# Patient Record
Sex: Male | Born: 1975 | Race: Black or African American | Hispanic: No | Marital: Single | Smoking: Current every day smoker
Health system: Southern US, Community
[De-identification: ages and names within clinical notes are randomized; demographics above are authoritative.]

## PROBLEM LIST (undated history)

## (undated) DIAGNOSIS — K529 Noninfective gastroenteritis and colitis, unspecified: Secondary | ICD-10-CM

---

## 2015-03-06 ENCOUNTER — Encounter (HOSPITAL_COMMUNITY): Payer: Self-pay | Admitting: Emergency Medicine

## 2015-03-06 ENCOUNTER — Inpatient Hospital Stay (HOSPITAL_COMMUNITY)
Admission: EM | Admit: 2015-03-06 | Discharge: 2015-03-10 | DRG: 342 | Disposition: A | Discharge status: Home / Self Care | Payer: Self-pay | Attending: Family Medicine | Admitting: Family Medicine

## 2015-03-06 DIAGNOSIS — D72829 Elevated white blood cell count, unspecified: Secondary | ICD-10-CM | POA: Diagnosis present

## 2015-03-06 DIAGNOSIS — Z8249 Family history of ischemic heart disease and other diseases of the circulatory system: Secondary | ICD-10-CM

## 2015-03-06 DIAGNOSIS — R3911 Hesitancy of micturition: Secondary | ICD-10-CM | POA: Diagnosis not present

## 2015-03-06 DIAGNOSIS — K529 Noninfective gastroenteritis and colitis, unspecified: Secondary | ICD-10-CM

## 2015-03-06 DIAGNOSIS — K6389 Other specified diseases of intestine: Principal | ICD-10-CM | POA: Diagnosis present

## 2015-03-06 DIAGNOSIS — Z833 Family history of diabetes mellitus: Secondary | ICD-10-CM

## 2015-03-06 DIAGNOSIS — K389 Disease of appendix, unspecified: Secondary | ICD-10-CM

## 2015-03-06 DIAGNOSIS — R1031 Right lower quadrant pain: Secondary | ICD-10-CM

## 2015-03-06 DIAGNOSIS — I10 Essential (primary) hypertension: Secondary | ICD-10-CM | POA: Diagnosis present

## 2015-03-06 DIAGNOSIS — K37 Unspecified appendicitis: Secondary | ICD-10-CM | POA: Diagnosis present

## 2015-03-06 DIAGNOSIS — N179 Acute kidney failure, unspecified: Secondary | ICD-10-CM | POA: Diagnosis not present

## 2015-03-06 HISTORY — DX: Noninfective gastroenteritis and colitis, unspecified: K52.9

## 2015-03-06 NOTE — ED Notes (Signed)
Pt reports lower right abdominal pain starting this morning. Pt denies n/v/d. Pt rates 9/10.

## 2015-03-07 ENCOUNTER — Observation Stay (HOSPITAL_COMMUNITY): Payer: Self-pay | Admitting: Certified Registered Nurse Anesthetist

## 2015-03-07 ENCOUNTER — Encounter (HOSPITAL_COMMUNITY): Payer: Self-pay | Admitting: General Surgery

## 2015-03-07 ENCOUNTER — Emergency Department (HOSPITAL_COMMUNITY): Payer: Self-pay

## 2015-03-07 ENCOUNTER — Observation Stay (HOSPITAL_COMMUNITY): Payer: MEDICAID | Admitting: Certified Registered Nurse Anesthetist

## 2015-03-07 ENCOUNTER — Encounter (HOSPITAL_COMMUNITY): Admission: EM | Disposition: A | Discharge status: Home / Self Care | Payer: Self-pay | Source: Home / Self Care

## 2015-03-07 DIAGNOSIS — K37 Unspecified appendicitis: Secondary | ICD-10-CM | POA: Diagnosis present

## 2015-03-07 HISTORY — PX: LAPAROSCOPIC APPENDECTOMY: SHX408

## 2015-03-07 LAB — CBC WITH DIFFERENTIAL/PLATELET
BASOS ABS: 0 10*3/uL (ref 0.0–0.1)
BASOS PCT: 0 % (ref 0–1)
Eosinophils Absolute: 0.3 10*3/uL (ref 0.0–0.7)
Eosinophils Relative: 2 % (ref 0–5)
HCT: 41.4 % (ref 39.0–52.0)
Hemoglobin: 13.8 g/dL (ref 13.0–17.0)
LYMPHS ABS: 2.1 10*3/uL (ref 0.7–4.0)
Lymphocytes Relative: 18 % (ref 12–46)
MCH: 29.9 pg (ref 26.0–34.0)
MCHC: 33.3 g/dL (ref 30.0–36.0)
MCV: 89.8 fL (ref 78.0–100.0)
MONO ABS: 0.9 10*3/uL (ref 0.1–1.0)
MONOS PCT: 8 % (ref 3–12)
NEUTROS ABS: 8.3 10*3/uL — AB (ref 1.7–7.7)
Neutrophils Relative %: 72 % (ref 43–77)
Platelets: 338 10*3/uL (ref 150–400)
RBC: 4.61 MIL/uL (ref 4.22–5.81)
RDW: 13.6 % (ref 11.5–15.5)
WBC: 11.6 10*3/uL — ABNORMAL HIGH (ref 4.0–10.5)

## 2015-03-07 LAB — URINALYSIS, ROUTINE W REFLEX MICROSCOPIC
BILIRUBIN URINE: NEGATIVE
Glucose, UA: NEGATIVE mg/dL
Hgb urine dipstick: NEGATIVE
Ketones, ur: NEGATIVE mg/dL
LEUKOCYTES UA: NEGATIVE
NITRITE: NEGATIVE
PROTEIN: NEGATIVE mg/dL
Specific Gravity, Urine: 1.02 (ref 1.005–1.030)
Urobilinogen, UA: 0.2 mg/dL (ref 0.0–1.0)
pH: 5.5 (ref 5.0–8.0)

## 2015-03-07 LAB — COMPREHENSIVE METABOLIC PANEL
ALBUMIN: 3.9 g/dL (ref 3.5–5.2)
ALT: 18 U/L (ref 0–53)
AST: 24 U/L (ref 0–37)
Alkaline Phosphatase: 44 U/L (ref 39–117)
Anion gap: 4 — ABNORMAL LOW (ref 5–15)
BUN: 18 mg/dL (ref 6–23)
CO2: 25 mmol/L (ref 19–32)
CREATININE: 1.31 mg/dL (ref 0.50–1.35)
Calcium: 9 mg/dL (ref 8.4–10.5)
Chloride: 109 mmol/L (ref 96–112)
GFR, EST AFRICAN AMERICAN: 79 mL/min — AB (ref >90)
GFR, EST NON AFRICAN AMERICAN: 68 mL/min — AB (ref >90)
Glucose, Bld: 98 mg/dL (ref 70–99)
POTASSIUM: 4.6 mmol/L (ref 3.5–5.1)
Sodium: 138 mmol/L (ref 135–145)
Total Bilirubin: 0.6 mg/dL (ref 0.3–1.2)
Total Protein: 6.9 g/dL (ref 6.0–8.3)

## 2015-03-07 LAB — LIPASE, BLOOD: LIPASE: 29 U/L (ref 11–59)

## 2015-03-07 SURGERY — APPENDECTOMY, LAPAROSCOPIC
Anesthesia: General | Site: Abdomen

## 2015-03-07 MED ORDER — MIDAZOLAM HCL 5 MG/5ML IJ SOLN
INTRAMUSCULAR | Status: DC | PRN
  Administered 2015-03-07: 2 mg via INTRAVENOUS

## 2015-03-07 MED ORDER — ENOXAPARIN SODIUM 40 MG/0.4ML ~~LOC~~ SOLN
40.0000 mg | SUBCUTANEOUS | Status: DC
  Administered 2015-03-08 – 2015-03-10 (×3): 40 mg via SUBCUTANEOUS
  Filled 2015-03-07 (×4): qty 0.4

## 2015-03-07 MED ORDER — HYDROMORPHONE HCL 1 MG/ML IJ SOLN
1.0000 mg | Freq: Once | INTRAMUSCULAR | Status: AC
  Administered 2015-03-07: 1 mg via INTRAVENOUS
  Filled 2015-03-07: qty 1

## 2015-03-07 MED ORDER — LIDOCAINE HCL (CARDIAC) 20 MG/ML IV SOLN
INTRAVENOUS | Status: DC | PRN
  Administered 2015-03-07: 100 mg via INTRAVENOUS

## 2015-03-07 MED ORDER — MIDAZOLAM HCL 2 MG/2ML IJ SOLN
INTRAMUSCULAR | Status: AC
  Filled 2015-03-07: qty 2

## 2015-03-07 MED ORDER — OXYCODONE-ACETAMINOPHEN 5-325 MG PO TABS
1.0000 | ORAL_TABLET | ORAL | Status: DC | PRN
  Administered 2015-03-07 – 2015-03-10 (×11): 2 via ORAL
  Filled 2015-03-07 (×11): qty 2

## 2015-03-07 MED ORDER — BUPIVACAINE-EPINEPHRINE 0.5% -1:200000 IJ SOLN
INTRAMUSCULAR | Status: DC | PRN
  Administered 2015-03-07: 7 mL

## 2015-03-07 MED ORDER — SODIUM CHLORIDE 0.9 % IV SOLN
INTRAVENOUS | Status: DC
  Administered 2015-03-07: 12:00:00 via INTRAVENOUS

## 2015-03-07 MED ORDER — FENTANYL CITRATE 0.05 MG/ML IJ SOLN
INTRAMUSCULAR | Status: DC | PRN
  Administered 2015-03-07 (×3): 50 ug via INTRAVENOUS
  Administered 2015-03-07: 100 ug via INTRAVENOUS

## 2015-03-07 MED ORDER — IOHEXOL 300 MG/ML  SOLN
50.0000 mL | Freq: Once | INTRAMUSCULAR | Status: AC | PRN
  Administered 2015-03-07: 50 mL via ORAL

## 2015-03-07 MED ORDER — DEXAMETHASONE SODIUM PHOSPHATE 10 MG/ML IJ SOLN
INTRAMUSCULAR | Status: DC | PRN
  Administered 2015-03-07: 10 mg via INTRAVENOUS

## 2015-03-07 MED ORDER — IOHEXOL 300 MG/ML  SOLN
100.0000 mL | Freq: Once | INTRAMUSCULAR | Status: AC | PRN
  Administered 2015-03-07: 100 mL via INTRAVENOUS

## 2015-03-07 MED ORDER — PROPOFOL 10 MG/ML IV BOLUS
INTRAVENOUS | Status: DC | PRN
  Administered 2015-03-07: 150 mg via INTRAVENOUS

## 2015-03-07 MED ORDER — ONDANSETRON HCL 4 MG/2ML IJ SOLN: 4.0000 mg | Freq: Four times a day (QID) | INTRAMUSCULAR | Status: DC | PRN

## 2015-03-07 MED ORDER — DEXAMETHASONE SODIUM PHOSPHATE 10 MG/ML IJ SOLN
INTRAMUSCULAR | Status: AC
  Filled 2015-03-07: qty 1

## 2015-03-07 MED ORDER — HYDROMORPHONE HCL 1 MG/ML IJ SOLN: 1.0000 mg | Freq: Once | INTRAMUSCULAR | Status: DC

## 2015-03-07 MED ORDER — NEOSTIGMINE METHYLSULFATE 10 MG/10ML IV SOLN
INTRAVENOUS | Status: DC | PRN
  Administered 2015-03-07: 5 mg via INTRAVENOUS

## 2015-03-07 MED ORDER — ONDANSETRON HCL 4 MG/2ML IJ SOLN
INTRAMUSCULAR | Status: AC
  Filled 2015-03-07: qty 2

## 2015-03-07 MED ORDER — GLYCOPYRROLATE 0.2 MG/ML IJ SOLN
INTRAMUSCULAR | Status: DC | PRN
  Administered 2015-03-07: 0.2 mg via INTRAVENOUS
  Administered 2015-03-07: 0.6 mg via INTRAVENOUS

## 2015-03-07 MED ORDER — HYDROMORPHONE HCL 1 MG/ML IJ SOLN
INTRAMUSCULAR | Status: AC
  Filled 2015-03-07: qty 1

## 2015-03-07 MED ORDER — BUPIVACAINE-EPINEPHRINE (PF) 0.5% -1:200000 IJ SOLN
INTRAMUSCULAR | Status: AC
  Filled 2015-03-07: qty 30

## 2015-03-07 MED ORDER — MORPHINE SULFATE 10 MG/ML IJ SOLN: 2.0000 mg | INTRAMUSCULAR | Status: DC | PRN

## 2015-03-07 MED ORDER — DEXTROSE 5 % IV SOLN
2.0000 g | INTRAVENOUS | Status: AC
  Administered 2015-03-07: 2 g via INTRAVENOUS

## 2015-03-07 MED ORDER — SODIUM CHLORIDE 0.9 % IV SOLN
3.0000 g | Freq: Once | INTRAVENOUS | Status: AC
  Administered 2015-03-07: 3 g via INTRAVENOUS
  Filled 2015-03-07: qty 3

## 2015-03-07 MED ORDER — FENTANYL CITRATE 0.05 MG/ML IJ SOLN
INTRAMUSCULAR | Status: AC
  Filled 2015-03-07: qty 5

## 2015-03-07 MED ORDER — LIDOCAINE HCL (CARDIAC) 20 MG/ML IV SOLN
INTRAVENOUS | Status: AC
  Filled 2015-03-07: qty 5

## 2015-03-07 MED ORDER — ROCURONIUM BROMIDE 100 MG/10ML IV SOLN
INTRAVENOUS | Status: AC
  Filled 2015-03-07: qty 1

## 2015-03-07 MED ORDER — LACTATED RINGERS IV SOLN: INTRAVENOUS | Status: DC

## 2015-03-07 MED ORDER — ONDANSETRON HCL 4 MG PO TABS
4.0000 mg | ORAL_TABLET | Freq: Four times a day (QID) | ORAL | Status: DC | PRN
  Filled 2015-03-07: qty 1

## 2015-03-07 MED ORDER — ONDANSETRON HCL 4 MG/2ML IJ SOLN
INTRAMUSCULAR | Status: DC | PRN
  Administered 2015-03-07: 4 mg via INTRAVENOUS

## 2015-03-07 MED ORDER — LACTATED RINGERS IR SOLN
Status: DC | PRN
  Administered 2015-03-07: 1

## 2015-03-07 MED ORDER — SODIUM CHLORIDE 0.9 % IV BOLUS (SEPSIS)
1000.0000 mL | INTRAVENOUS | Status: AC
  Administered 2015-03-07: 1000 mL via INTRAVENOUS

## 2015-03-07 MED ORDER — CEFOTETAN DISODIUM-DEXTROSE 2-2.08 GM-% IV SOLR
INTRAVENOUS | Status: AC
  Filled 2015-03-07: qty 50

## 2015-03-07 MED ORDER — IBUPROFEN 600 MG PO TABS
600.0000 mg | ORAL_TABLET | Freq: Four times a day (QID) | ORAL | Status: DC
  Administered 2015-03-07 – 2015-03-09 (×6): 600 mg via ORAL
  Filled 2015-03-07: qty 3
  Filled 2015-03-07 (×2): qty 1
  Filled 2015-03-07 (×2): qty 3
  Filled 2015-03-07 (×2): qty 1
  Filled 2015-03-07: qty 3
  Filled 2015-03-07 (×2): qty 1
  Filled 2015-03-07: qty 3
  Filled 2015-03-07 (×5): qty 1
  Filled 2015-03-07 (×2): qty 3
  Filled 2015-03-07: qty 1

## 2015-03-07 MED ORDER — GLYCOPYRROLATE 0.2 MG/ML IJ SOLN
INTRAMUSCULAR | Status: AC
  Filled 2015-03-07: qty 3

## 2015-03-07 MED ORDER — PNEUMOCOCCAL VAC POLYVALENT 25 MCG/0.5ML IJ INJ
0.5000 mL | INJECTION | INTRAMUSCULAR | Status: AC
  Administered 2015-03-08: 0.5 mL via INTRAMUSCULAR
  Filled 2015-03-07 (×2): qty 0.5

## 2015-03-07 MED ORDER — ROCURONIUM BROMIDE 100 MG/10ML IV SOLN
INTRAVENOUS | Status: DC | PRN
  Administered 2015-03-07: 35 mg via INTRAVENOUS

## 2015-03-07 MED ORDER — ONDANSETRON HCL 4 MG/2ML IJ SOLN: 4.0000 mg | INTRAMUSCULAR | Status: DC

## 2015-03-07 MED ORDER — PROPOFOL 10 MG/ML IV BOLUS
INTRAVENOUS | Status: AC
  Filled 2015-03-07: qty 20

## 2015-03-07 MED ORDER — INFLUENZA VAC SPLIT QUAD 0.5 ML IM SUSY
0.5000 mL | PREFILLED_SYRINGE | INTRAMUSCULAR | Status: AC
  Administered 2015-03-08: 0.5 mL via INTRAMUSCULAR
  Filled 2015-03-07 (×2): qty 0.5

## 2015-03-07 MED ORDER — LACTATED RINGERS IV SOLN
INTRAVENOUS | Status: DC | PRN
  Administered 2015-03-07 (×2): via INTRAVENOUS

## 2015-03-07 MED ORDER — ONDANSETRON HCL 4 MG/2ML IJ SOLN
4.0000 mg | INTRAMUSCULAR | Status: AC
  Administered 2015-03-07: 4 mg via INTRAVENOUS
  Filled 2015-03-07: qty 2

## 2015-03-07 MED ORDER — SUCCINYLCHOLINE CHLORIDE 20 MG/ML IJ SOLN
INTRAMUSCULAR | Status: DC | PRN
Start: 2015-03-07 — End: 2015-03-07
  Administered 2015-03-07: 100 mg via INTRAVENOUS

## 2015-03-07 MED ORDER — HYDROMORPHONE HCL 1 MG/ML IJ SOLN
0.2500 mg | INTRAMUSCULAR | Status: DC | PRN
  Administered 2015-03-07 (×4): 0.5 mg via INTRAVENOUS

## 2015-03-07 MED ORDER — 0.9 % SODIUM CHLORIDE (POUR BTL) OPTIME
TOPICAL | Status: DC | PRN
  Administered 2015-03-07: 1000 mL

## 2015-03-07 SURGICAL SUPPLY — 35 items
APPLIER CLIP 5 13 M/L LIGAMAX5 (MISCELLANEOUS)
APPLIER CLIP ROT 10 11.4 M/L (STAPLE)
CABLE HI FREQUENCY MONOPOLAR (ELECTROSURGICAL) ×3 IMPLANT
CHLORAPREP W/TINT 26ML (MISCELLANEOUS) ×3 IMPLANT
CLIP APPLIE 5 13 M/L LIGAMAX5 (MISCELLANEOUS) IMPLANT
CLIP APPLIE ROT 10 11.4 M/L (STAPLE) IMPLANT
CUTTER FLEX LINEAR 45M (STAPLE) ×3 IMPLANT
DECANTER SPIKE VIAL GLASS SM (MISCELLANEOUS) ×3 IMPLANT
DRAPE LAPAROSCOPIC ABDOMINAL (DRAPES) ×3 IMPLANT
DRAPE UTILITY XL STRL (DRAPES) ×3 IMPLANT
ELECT REM PT RETURN 9FT ADLT (ELECTROSURGICAL) ×3
ELECTRODE REM PT RTRN 9FT ADLT (ELECTROSURGICAL) ×1 IMPLANT
GLOVE BIO SURGEON STRL SZ 6.5 (GLOVE) ×2 IMPLANT
GLOVE BIO SURGEONS STRL SZ 6.5 (GLOVE) ×1
GLOVE BIOGEL PI IND STRL 7.0 (GLOVE) ×1 IMPLANT
GLOVE BIOGEL PI INDICATOR 7.0 (GLOVE) ×2
KIT BASIN OR (CUSTOM PROCEDURE TRAY) ×3 IMPLANT
LIQUID BAND (GAUZE/BANDAGES/DRESSINGS) ×3 IMPLANT
POUCH SPECIMEN RETRIEVAL 10MM (ENDOMECHANICALS) ×3 IMPLANT
RELOAD 45 VASCULAR/THIN (ENDOMECHANICALS) ×3 IMPLANT
RELOAD STAPLE TA45 3.5 REG BLU (ENDOMECHANICALS) ×3 IMPLANT
SET IRRIG TUBING LAPAROSCOPIC (IRRIGATION / IRRIGATOR) ×3 IMPLANT
SHEARS HARMONIC ACE PLUS 36CM (ENDOMECHANICALS) ×3 IMPLANT
SLEEVE XCEL OPT CAN 5 100 (ENDOMECHANICALS) IMPLANT
SOLUTION ANTI FOG 6CC (MISCELLANEOUS) ×3 IMPLANT
SUT VIC AB 4-0 PS2 27 (SUTURE) ×3 IMPLANT
TOWEL OR 17X26 10 PK STRL BLUE (TOWEL DISPOSABLE) ×3 IMPLANT
TOWEL OR NON WOVEN STRL DISP B (DISPOSABLE) ×3 IMPLANT
TRAY FOLEY CATH 14FRSI W/METER (CATHETERS) ×3 IMPLANT
TRAY LAPAROSCOPIC (CUSTOM PROCEDURE TRAY) ×3 IMPLANT
TROCAR BLADELESS OPT 5 100 (ENDOMECHANICALS) ×3 IMPLANT
TROCAR BLADELESS OPT 5 75 (ENDOMECHANICALS) ×3 IMPLANT
TROCAR SLEEVE XCEL 5X75 (ENDOMECHANICALS) IMPLANT
TROCAR XCEL BLUNT TIP 100MML (ENDOMECHANICALS) ×3 IMPLANT
TUBING INSUFFLATION 10FT LAP (TUBING) ×3 IMPLANT

## 2015-03-07 NOTE — Op Note (Signed)
Georga BoraCurtis Barfield [id number]   PRE-OPERATIVE DIAGNOSIS:  appendicitis  POST-OPERATIVE DIAGNOSIS:  Probable epiploic appendagitis    Procedure(s): APPENDECTOMY LAPAROSCOPIC  SURGEON:  Surgeon(s): Romie LeveeAlicia Teonia Yager, MD  ASSISTANT: none   ANESTHESIA:   local and general  EBL:   min  Delay start of Pharmacological VTE agent (>24hrs) due to surgical blood loss or risk of bleeding:  no  DRAINS: none   SPECIMEN:  Source of Specimen:  appendix  DISPOSITION OF SPECIMEN:  PATHOLOGY  COUNTS:  YES  PLAN OF CARE: Admit for overnight observation  PATIENT DISPOSITION:  PACU - hemodynamically stable.   INDICATIONS: Patient with concerning symptoms & work up suspicious for appendicitis.  Surgery was recommended:  The anatomy & physiology of the digestive tract was discussed.  The pathophysiology of appendicitis was discussed.  Natural history risks without surgery was discussed.   I feel the risks of no intervention will lead to serious problems that outweigh the operative risks; therefore, I recommended diagnostic laparoscopy with removal of appendix to remove the pathology.  Laparoscopic & open techniques were discussed.   I noted a good likelihood this will help address the problem.    Risks such as bleeding, infection, abscess, leak, reoperation, possible ostomy, hernia, heart attack, death, and other risks were discussed.  Goals of post-operative recovery were discussed as well.  We will work to minimize complications.  Questions were answered.  The patient expresses understanding & wishes to proceed with surgery.  OR FINDINGS: cecum adherent to abdominal wall with appendix associated with this.  Appendagitis appeared to be cause of inflammation and appendix associated with this as secondary process.    DESCRIPTION:   The patient was identified & brought into the operating room. The patient was positioned supine with left arm tucked. SCDs were active during the entire case. The patient  underwent general anesthesia without any difficulty.  A foley catheter was inserted under sterile conditions. The abdomen was prepped and draped in a sterile fashion. A Surgical Timeout confirmed our plan.   I made a transverse incision through the inferior umbilical fold.  I made a nick in the infraumbilical fascia and confirmed peritoneal entry.  I placed a stay suture and then the [facility].  We induced carbon dioxide insufflation.  Camera inspection revealed no injury.  I placed additional ports under direct laparoscopic visualization.  I evaluated the abdomen.  The cecum was adherent to the abdominal wall with the appendix associated with this but it did not appear to be the cause of the inflammation.  This was taken down bluntly and appeared to be most consistent with appendagitis.  Given the proximity to the appendix, it was decided to perform an appendectomy.   I mobilized the terminal ileum to proximal ascending colon in a lateral to medial fashion.  I took care to avoid injuring any retroperitoneal structures.   I freed the appendix off its attachments to the ascending colon and cecal mesentery.  I elevated the appendix. I skeletonized & ligated the mesoappendix with a white load stapler. I was able to free off the base of the appendix which was still viable.  I stapled the appendix off the cecum using a blue load laparoscopic stapler. I took a healthy cuff viable cecum.  I placed the appendix inside an EndoCatch bag and removed out the [place] port.  I did copious irrigation. Hemostasis was good in the mesoappendix, colon mesentery, and retroperitoneum. Staple line was intact on the cecum with no bleeding. I washed out  the pelvis, retrohepatic space and right paracolic gutter. I washed out the left side as well.  Hemostasis is good. There was no perforation or injury.  Because the area cleaned up well after irrigation, I did not place a drain.  I did lay the omentum over the inflamed epiploica  prior to exiting the abdomen.   I aspirated the carbon dioxide. I removed the ports. I closed the umbilical fascia site using a 0 Vicryl stitch. I closed skin using 4-0 vicryl stitch.  Sterile dressings were applied.  Patient was extubated and sent to the recovery room.  I discussed the operative findings with the patient's family. I suspect the patient is going used in the hospital at least overnight and will need antibiotics for 0 days. Questions answered. They expressed understanding and appreciation.

## 2015-03-07 NOTE — H&P (Signed)
Casey Brooks is an 39 y.o. male.   Chief Complaint: RLQ pain HPI: Pt complains of RLQ pain since yesterday.  He denies any changes in bowel habits, fevers or nausea.  Pain is constant and worse with movement.  He denies any urinary symptoms or flank pain.    History reviewed. No pertinent past medical history.  History reviewed. No pertinent past surgical history.  History reviewed. No pertinent family history. Social History:  reports that he has never smoked. He does not have any smokeless tobacco history on file. He reports that he drinks alcohol. He reports that he uses illicit drugs (Cocaine and Marijuana).  Allergies: No Known Allergies   (Not in a hospital admission)  Results for orders placed or performed during the hospital encounter of 03/06/15 (from the past 48 hour(s))  CBC with Differential     Status: Abnormal   Collection Time: 03/07/15 12:22 AM  Result Value Ref Range   WBC 11.6 (H) 4.0 - 10.5 K/uL   RBC 4.61 4.22 - 5.81 MIL/uL   Hemoglobin 13.8 13.0 - 17.0 g/dL   HCT 41.4 39.0 - 52.0 %   MCV 89.8 78.0 - 100.0 fL   MCH 29.9 26.0 - 34.0 pg   MCHC 33.3 30.0 - 36.0 g/dL   RDW 13.6 11.5 - 15.5 %   Platelets 338 150 - 400 K/uL   Neutrophils Relative % 72 43 - 77 %   Neutro Abs 8.3 (H) 1.7 - 7.7 K/uL   Lymphocytes Relative 18 12 - 46 %   Lymphs Abs 2.1 0.7 - 4.0 K/uL   Monocytes Relative 8 3 - 12 %   Monocytes Absolute 0.9 0.1 - 1.0 K/uL   Eosinophils Relative 2 0 - 5 %   Eosinophils Absolute 0.3 0.0 - 0.7 K/uL   Basophils Relative 0 0 - 1 %   Basophils Absolute 0.0 0.0 - 0.1 K/uL  Comprehensive metabolic panel     Status: Abnormal   Collection Time: 03/07/15 12:22 AM  Result Value Ref Range   Sodium 138 135 - 145 mmol/L   Potassium 4.6 3.5 - 5.1 mmol/L   Chloride 109 96 - 112 mmol/L   CO2 25 19 - 32 mmol/L   Glucose, Bld 98 70 - 99 mg/dL   BUN 18 6 - 23 mg/dL   Creatinine, Ser 1.31 0.50 - 1.35 mg/dL   Calcium 9.0 8.4 - 10.5 mg/dL   Total Protein 6.9 6.0  - 8.3 g/dL   Albumin 3.9 3.5 - 5.2 g/dL   AST 24 0 - 37 U/L   ALT 18 0 - 53 U/L   Alkaline Phosphatase 44 39 - 117 U/L   Total Bilirubin 0.6 0.3 - 1.2 mg/dL   GFR calc non Af Amer 68 (L) >90 mL/min   GFR calc Af Amer 79 (L) >90 mL/min    Comment: (NOTE) The eGFR has been calculated using the CKD EPI equation. This calculation has not been validated in all clinical situations. eGFR's persistently <90 mL/min signify possible Chronic Kidney Disease.    Anion gap 4 (L) 5 - 15  Lipase, blood     Status: None   Collection Time: 03/07/15 12:22 AM  Result Value Ref Range   Lipase 29 11 - 59 U/L  Urinalysis, Routine w reflex microscopic     Status: None   Collection Time: 03/07/15  4:09 AM  Result Value Ref Range   Color, Urine YELLOW YELLOW   APPearance CLEAR CLEAR   Specific Gravity,  Urine 1.020 1.005 - 1.030   pH 5.5 5.0 - 8.0   Glucose, UA NEGATIVE NEGATIVE mg/dL   Hgb urine dipstick NEGATIVE NEGATIVE   Bilirubin Urine NEGATIVE NEGATIVE   Ketones, ur NEGATIVE NEGATIVE mg/dL   Protein, ur NEGATIVE NEGATIVE mg/dL   Urobilinogen, UA 0.2 0.0 - 1.0 mg/dL   Nitrite NEGATIVE NEGATIVE   Leukocytes, UA NEGATIVE NEGATIVE    Comment: MICROSCOPIC NOT DONE ON URINES WITH NEGATIVE PROTEIN, BLOOD, LEUKOCYTES, NITRITE, OR GLUCOSE <1000 mg/dL.   Ct Abdomen Pelvis W Contrast  03/07/2015   CLINICAL DATA:  Lower abdominal pain beginning this morning.  EXAM: CT ABDOMEN AND PELVIS WITH CONTRAST  TECHNIQUE: Multidetector CT imaging of the abdomen and pelvis was performed using the standard protocol following bolus administration of intravenous contrast.  CONTRAST:  72m OMNIPAQUE IOHEXOL 300 MG/ML SOLN, 1057mOMNIPAQUE IOHEXOL 300 MG/ML SOLN  COMPARISON:  None.  FINDINGS: LUNG BASES: Included view of the lung bases are clear. Visualized heart and pericardium are unremarkable.  SOLID ORGANS: The liver, spleen, gallbladder, pancreas and adrenal glands are unremarkable.  GASTROINTESTINAL TRACT: The stomach,  small and large bowel are normal in course and caliber without inflammatory changes. 3 mm appendicolith, and the appendix is otherwise unremarkable. Enteric contrast has not yet reached the distal small bowel.  KIDNEYS/ URINARY TRACT: Kidneys are orthotopic, demonstrating symmetric enhancement. No nephrolithiasis, hydronephrosis or solid renal masses. The unopacified ureters are normal in course and caliber. Urinary bladder is partially distended and unremarkable.  PERITONEUM/RETROPERITONEUM: Aortoiliac vessels are normal in course and caliber. No lymphadenopathy by CT size criteria. Internal reproductive organs are unremarkable. No intraperitoneal free fluid nor free air.  SOFT TISSUE/OSSEOUS STRUCTURES: Non-suspicious. Moderate L5-S1 disc height loss, with endplate spurring most consistent with degenerative disc.  IMPRESSION: No acute intra-abdominal or pelvic process.   Electronically Signed   By: CoElon Alas On: 03/07/2015 05:17    Review of Systems  Constitutional: Negative for fever and chills.  Cardiovascular: Negative for chest pain and palpitations.  Gastrointestinal: Positive for abdominal pain. Negative for nausea and vomiting.  Genitourinary: Negative for dysuria, urgency, frequency and hematuria.  Neurological: Negative for dizziness, loss of consciousness and headaches.    Blood pressure 116/57, pulse 57, temperature 99.2 F (37.3 C), temperature source Oral, resp. rate 20, height 5' 11"  (1.803 m), weight 180 lb (81.647 kg), SpO2 98 %. Physical Exam  Constitutional: He is oriented to person, place, and time. He appears well-developed and well-nourished. No distress.  HENT:  Head: Normocephalic and atraumatic.  Eyes: Conjunctivae and EOM are normal. Pupils are equal, round, and reactive to light.  Neck: Normal range of motion. Neck supple.  Cardiovascular: Normal rate and regular rhythm.   Respiratory: Breath sounds normal. No respiratory distress.  GI: Soft. He exhibits no  distension and no mass. There is tenderness. There is no rebound and no guarding.  RLQ  Musculoskeletal: Normal range of motion.  Neurological: He is alert and oriented to person, place, and time.  Skin: Skin is warm and dry. He is not diaphoretic.     Assessment/Plan Pt with RLQ and tenderness to palpation at McBurney's point with positive Rofsing's sign on exam.  CT concerning for inflammation at tip of appendix per my read.  We discussed non-operative and operative management.  We discussed the risks and benefits of surgery and the possibility that his appendix could be normal when evaluated or we could find another source for his pain on laparoscopy.  I believe he  understands all of this and has agreed to proceed with surgery.    Mauria Asquith C. [id number]/9432, 0:03 AM

## 2015-03-07 NOTE — Anesthesia Preprocedure Evaluation (Addendum)
Anesthesia Evaluation  Patient identified by MRN, date of birth, ID band Patient awake    Reviewed: Allergy & Precautions, H&P , NPO status , Patient's Chart, lab work & pertinent test results  Airway Mallampati: II  TM Distance: >3 FB Neck ROM: full    Dental  (+) Dental Advisory Given, Chipped Small chip right upper front tooth:   Pulmonary Current Smoker,  breath sounds clear to auscultation  Pulmonary exam normal       Cardiovascular Exercise Tolerance: Good negative cardio ROS  Rhythm:regular Rate:Normal     Neuro/Psych negative neurological ROS  negative psych ROS   GI/Hepatic negative GI ROS, Neg liver ROS,   Endo/Other  negative endocrine ROS  Renal/GU negative Renal ROS  negative genitourinary   Musculoskeletal   Abdominal   Peds  Hematology negative hematology ROS (+)   Anesthesia Other Findings   Reproductive/Obstetrics negative OB ROS                            Anesthesia Physical Anesthesia Plan  ASA: II  Anesthesia Plan: General   Post-op Pain Management:    Induction: Intravenous, Rapid sequence and Cricoid pressure planned  Airway Management Planned: Oral ETT  Additional Equipment:   Intra-op Plan:   Post-operative Plan: Extubation in OR  Informed Consent: I have reviewed the patients History and Physical, chart, labs and discussed the procedure including the risks, benefits and alternatives for the proposed anesthesia with the patient or authorized representative who has indicated his/her understanding and acceptance.   Dental Advisory Given  Plan Discussed with: CRNA and Surgeon  Anesthesia Plan Comments:         Anesthesia Quick Evaluation

## 2015-03-07 NOTE — Anesthesia Postprocedure Evaluation (Signed)
  Anesthesia Post-op Note  Patient: Casey Brooks  Procedure(s) Performed: Procedure(s) (LRB): APPENDECTOMY LAPAROSCOPIC (N/A)  Patient Location: PACU  Anesthesia Type: General  Level of Consciousness: awake and alert   Airway and Oxygen Therapy: Patient Spontanous Breathing  Post-op Pain: mild  Post-op Assessment: Post-op Vital signs reviewed, Patient's Cardiovascular Status Stable, Respiratory Function Stable, Patent Airway and No signs of Nausea or vomiting  Last Vitals:  Filed Vitals:   03/07/15 1204  BP: 133/59  Pulse: 49  Temp: 36.4 C  Resp: 16    Post-op Vital Signs: stable   Complications: No apparent anesthesia complications

## 2015-03-07 NOTE — ED Notes (Signed)
His mother enquires about "what's going on with my son?"  I inform her that the plan is for the surgeon to come and see him per Dr. Lynelle DoctorKnapp.  She then states "Don't you know what's going on with him--you're his nurse aren't you?" She then asks "What is your (my) name?"  This conversation occurred in the presence of our charge nurse, Pattie.  Pt. Is in no distress; stands and tells me "I feel better than I did".

## 2015-03-07 NOTE — ED Notes (Signed)
Consent signed--to OR at this time.  Report given to OR nurse, including pt. NKDA and he has received Unasyn at ~6am.

## 2015-03-07 NOTE — ED Notes (Signed)
I have just received a call from the [facility] And am informed by them that Dr. Maisie Fushomas has just posted a lap. Appendectomy for him; which I relay to pt. And his mother.  Before his mother seemed angry--she no longer does.

## 2015-03-07 NOTE — ED Provider Notes (Signed)
CSN: [id number]     Arrival date & time 03/06/15  2305 History   First MD Initiated Contact with Patient 03/07/15 [phone]     Chief Complaint  Patient presents with  . Flank Pain   (Consider location/radiation/quality/duration/timing/severity/associated sxs/prior Treatment) HPI  Casey Brooks is a 39 yo male presenting with abdominal pain. He states his pain began shortly after he began work around 9 am. He states he originally noticed the pain around his belly button. The pain has seemed to travel down to his right lower quadrant throughout the day. He also noticed his pain was worse with bending over, lifting or jumping.  He denies any nausea, vomiting or diarrhea but notes he has not had much appetite today.  He denies any fever, or chills, or testicular pain.  History reviewed. No pertinent past medical history. History reviewed. No pertinent past surgical history. History reviewed. No pertinent family history. History  Substance Use Topics  . Smoking status: Never Smoker   . Smokeless tobacco: Not on file  . Alcohol Use: Yes     Comment: sometimes    Review of Systems  Constitutional: Negative for fever and chills.  HENT: Negative for sore throat.   Eyes: Negative for visual disturbance.  Respiratory: Negative for cough and shortness of breath.   Cardiovascular: Negative for chest pain and leg swelling.  Gastrointestinal: Positive for abdominal pain. Negative for nausea, vomiting and diarrhea.  Genitourinary: Negative for dysuria.  Musculoskeletal: Negative for myalgias.  Skin: Negative for rash.  Neurological: Negative for weakness, numbness and headaches.    Allergies  Review of patient's allergies indicates no known allergies.  Home Medications   Prior to Admission medications   Not on File   BP 128/71 mmHg  Pulse 88  Temp(Src) 97.7 F (36.5 C) (Oral)  Resp 15  Ht 5\' 11"  (1.803 m)  Wt 180 lb (81.647 kg)  BMI 25.12 kg/m2  SpO2 99% Physical Exam    Constitutional: He is oriented to person, place, and time. He appears well-developed and well-nourished. No distress.  HENT:  Head: Normocephalic and atraumatic.  Mouth/Throat: Oropharynx is clear and moist. No oropharyngeal exudate.  Eyes: Conjunctivae are normal.  Neck: Neck supple. No thyromegaly present.  Cardiovascular: Normal rate, regular rhythm and intact distal pulses.   Pulmonary/Chest: Effort normal and breath sounds normal. No respiratory distress. He has no wheezes. He has no rales. He exhibits no tenderness.  Abdominal: Soft. He exhibits no distension and no mass. There is no hepatosplenomegaly. There is tenderness in the right lower quadrant and periumbilical area. There is tenderness at McBurney's point. There is no rigidity, no rebound, no guarding, no CVA tenderness and negative Murphy's sign.    Pt also has  Rovsing's sign  Musculoskeletal: He exhibits no tenderness.  Lymphadenopathy:    He has no cervical adenopathy.  Neurological: He is alert and oriented to person, place, and time.  Skin: Skin is warm and dry. No rash noted. He is not diaphoretic.  Psychiatric: He has a normal mood and affect.  Nursing note and vitals reviewed.   ED Course  Procedures (including critical care time) Labs Review Labs Reviewed  CBC WITH DIFFERENTIAL/PLATELET - Abnormal; Notable for the following:    WBC 11.6 (*)    Neutro Abs 8.3 (*)    All other components within normal limits  COMPREHENSIVE METABOLIC PANEL - Abnormal; Notable for the following:    GFR calc non Af Amer 68 (*)    GFR calc Af Denyse DagoAmer  79 (*)    Anion gap 4 (*)    All other components within normal limits  LIPASE, BLOOD  URINALYSIS, ROUTINE W REFLEX MICROSCOPIC    Imaging Review Ct Abdomen Pelvis W Contrast  03/07/2015   CLINICAL DATA:  Lower abdominal pain beginning this morning.  EXAM: CT ABDOMEN AND PELVIS WITH CONTRAST  TECHNIQUE: Multidetector CT imaging of the abdomen and pelvis was performed using the  standard protocol following bolus administration of intravenous contrast.  CONTRAST:  50mL OMNIPAQUE IOHEXOL 300 MG/ML SOLN, OMNIPAQUE IOHEXOL 300 MG/ML SOLN  COMPARISON:  None.  FINDINGS: LUNG BASES: Included view of the lung bases are clear. Visualized heart and pericardium are unremarkable.  SOLID ORGANS: The liver, spleen, gallbladder, pancreas and adrenal glands are unremarkable.  GASTROINTESTINAL TRACT: The stomach, small and large bowel are normal in course and caliber without inflammatory changes. 3 mm appendicolith, and the appendix is otherwise unremarkable. Enteric contrast has not yet reached the distal small bowel.  KIDNEYS/ URINARY TRACT: Kidneys are orthotopic, demonstrating symmetric enhancement. No nephrolithiasis, hydronephrosis or solid renal masses. The unopacified ureters are normal in course and caliber. Urinary bladder is partially distended and unremarkable.  PERITONEUM/RETROPERITONEUM: Aortoiliac vessels are normal in course and caliber. No lymphadenopathy by CT size criteria. Internal reproductive organs are unremarkable. No intraperitoneal free fluid nor free air.  SOFT TISSUE/OSSEOUS STRUCTURES: Non-suspicious. Moderate L5-S1 disc height loss, with endplate spurring most consistent with degenerative disc.  IMPRESSION: No acute intra-abdominal or pelvic process.   Electronically Signed   By: Awilda Metro   On: 03/07/2015 05:17     EKG Interpretation None      MDM   Final diagnoses:  RLQ abdominal pain  Appendicolith   39 yo with abd pain worsening throughout the day, initially generalized and progressively more localized to right lower quadrant.  On exam, peri-umbilical TTP and exquisitely TTP on RLQ and a positive Rovsin's sign. NS bolus, pain and nausea meds, CBC, CMP, Lipase, UA, CT abd/pelvis.   Labs and imaging reviewed: Mildly elevated WBC: 11.6, CT: shows appendicolith but otherwise normal appearing appendix. However, due presentation and exam, surgery  will be consulted due to concern for exam consistent with appendicitis. Case discussed with Dr. Lynelle Doctor.  At end of shift, Dr. Lynelle Doctor assumed care of pt.  Awaiting surgical consult.  Filed Vitals:   03/06/15 2317 03/07/15 0459  BP: 128/71 116/57  Pulse: 88 57  Temp: 97.7 F (36.5 C) 99.2 F (37.3 C)  TempSrc: Oral Oral  Resp: 15 20  Height:  (1.803 m)   Weight: 180 lb (81.647 kg)   SpO2: 99% 98%   Meds given in ED:  Medications  Ampicillin-Sulbactam (UNASYN) 3 g in sodium chloride 0.9 % 100 mL IVPB (3 g Intravenous New Bag/Given 03/07/15 0605)  sodium chloride 0.9 % bolus 1,000 mL (1,000 mLs Intravenous New Bag/Given 03/07/15 0356)  iohexol (OMNIPAQUE) 300 MG/ML solution 50 mL (50 mLs Oral Contrast Given 03/07/15 0337)  iohexol (OMNIPAQUE) 300 MG/ML solution 100 mL (100 mLs Intravenous Contrast Given 03/07/15 0429)  ondansetron (ZOFRAN) injection 4 mg (4 mg Intravenous Given 03/07/15 0441)  HYDROmorphone (DILAUDID) injection 1 mg (1 mg Intravenous Given 03/07/15 0442)    New Prescriptions   No medications on file       Harle Battiest, NP 03/07/15 1555  Devoria Albe, MD 03/08/15 [phone]

## 2015-03-07 NOTE — ED Notes (Signed)
Patient transported to CT 

## 2015-03-07 NOTE — Transfer of Care (Signed)
Immediate Anesthesia Transfer of Care Note  Patient: Casey Brooks  Procedure(s) Performed: Procedure(s) (LRB): APPENDECTOMY LAPAROSCOPIC (N/A)  Patient Location: PACU  Anesthesia Type: General  Level of Consciousness: sedated, patient cooperative and responds to stimulation  Airway & Oxygen Therapy: Patient Spontanous Breathing and Patient connected to face mask oxgen  Post-op Assessment: Report given to PACU RN and Post -op Vital signs reviewed and stable  Post vital signs: Reviewed and stable  Complications: No apparent anesthesia complications

## 2015-03-07 NOTE — ED Notes (Signed)
Pt. Belongings are in locker # 32. Nurse was notified.

## 2015-03-07 NOTE — Anesthesia Procedure Notes (Signed)
Procedure Name: Intubation Date/Time: 03/07/2015 10:09 AM Performed by: Orest DikesPETERS, Ysenia Filice J Pre-anesthesia Checklist: Patient identified, Emergency Drugs available, Suction available and Patient being monitored Patient Re-evaluated:Patient Re-evaluated prior to inductionOxygen Delivery Method: Circle System Utilized Preoxygenation: Pre-oxygenation with 100% oxygen Intubation Type: IV induction, Rapid sequence and Cricoid Pressure applied Laryngoscope Size: Mac and 4 Grade View: Grade I Tube type: Oral Number of attempts: 1 Airway Equipment and Method: Stylet and Oral airway Placement Confirmation: ETT inserted through vocal cords under direct vision,  positive ETCO2 and breath sounds checked- equal and bilateral Secured at: 21 cm Tube secured with: Tape Dental Injury: Teeth and Oropharynx as per pre-operative assessment

## 2015-03-07 NOTE — ED Provider Notes (Addendum)
Patient reports he started out with diffuse abdominal pain on 312 that has now settled into his right lower quadrant. He reports deep breathing and walking and certain movements make the pain worse. He denies known fever nausea or vomiting.  On exam patient is noted to have significant right lower quadrant pain to palpation with Rovsings sign.   Although patient's CT scan does not show signs of acute appendicitis he does have an appendicolith and a mild elevation of his white blood cell count. We are going to have surgery evaluate patient.  06:31 Dr Daphine DeutscherMartin will see patient in the ED.   Medical screening examination/treatment/procedure(s) were conducted as a shared visit with non-physician practitioner(s) and myself.  I personally evaluated the patient during the encounter.   EKG Interpretation None      Diagnoses that have been ruled out:  None  Diagnoses that are still under consideration:  None  Final diagnoses:  RLQ abdominal pain  Appendicolith     Devoria AlbeIva Tunis Gentle, MD, Concha PyoFACEP   Zigmund Linse, MD 03/07/15 [id number]  Devoria AlbeIva Shawonda Kerce, MD 03/07/15 [phone]

## 2015-03-08 ENCOUNTER — Encounter (HOSPITAL_COMMUNITY): Payer: Self-pay | Admitting: General Surgery

## 2015-03-08 LAB — BASIC METABOLIC PANEL
Anion gap: 9 (ref 5–15)
BUN: 14 mg/dL (ref 6–23)
CALCIUM: 8.8 mg/dL (ref 8.4–10.5)
CO2: 23 mmol/L (ref 19–32)
CREATININE: 1.19 mg/dL (ref 0.50–1.35)
Chloride: 109 mmol/L (ref 96–112)
GFR calc Af Amer: 89 mL/min — ABNORMAL LOW (ref >90)
GFR, EST NON AFRICAN AMERICAN: 77 mL/min — AB (ref >90)
GLUCOSE: 107 mg/dL — AB (ref 70–99)
Potassium: 4 mmol/L (ref 3.5–5.1)
SODIUM: 141 mmol/L (ref 135–145)

## 2015-03-08 LAB — CBC
HCT: 35.3 % — ABNORMAL LOW (ref 39.0–52.0)
HEMOGLOBIN: 12 g/dL — AB (ref 13.0–17.0)
MCH: 29.9 pg (ref 26.0–34.0)
MCHC: 34 g/dL (ref 30.0–36.0)
MCV: 88 fL (ref 78.0–100.0)
Platelets: 352 10*3/uL (ref 150–400)
RBC: 4.01 MIL/uL — ABNORMAL LOW (ref 4.22–5.81)
RDW: 13.3 % (ref 11.5–15.5)
WBC: 12.1 10*3/uL — ABNORMAL HIGH (ref 4.0–10.5)

## 2015-03-08 NOTE — Progress Notes (Signed)
1 Day Post-Op  Subjective: He looks good, and really no complaints.  I am going to recheck labs.  Dr. Maisie Fushomas wanted to send him home on NSAID, but we will check renal function before making decision. Objective: Vital signs in last 24 hours: Temp:  [97.5 F (36.4 C)-98.4 F (36.9 C)] 98.2 F (36.8 C) (03/14 0600) Pulse Rate:  [49-94] 85 (03/14 0600) Resp:  [12-18] 18 (03/14 0600) BP: (106-133)/(56-90) 128/86 mmHg (03/14 0600) SpO2:  [99 %-100 %] 99 % (03/14 0600) Last BM Date: 03/05/15 Regular  Diet  PO not recorded Good urine output Afebrile, VSS No labs Intake/Output from previous day: 03/13 0701 - 03/14 0700 In: 1812.5 [I.V.:1812.5] Out: 1050 [Urine:1050] Intake/Output this shift:    General appearance: alert, cooperative and no distress GI: soft sore, site looks fine.  Lab Results:   Recent Labs  03/07/15 0022  WBC 11.6*  HGB 13.8  HCT 41.4  PLT 338    BMET  Recent Labs  03/07/15 0022  NA 138  K 4.6  CL 109  CO2 25  GLUCOSE 98  BUN 18  CREATININE 1.31  CALCIUM 9.0   PT/INR No results for input(s): LABPROT, INR in the last 72 hours.   Recent Labs Lab 03/07/15 0022  AST 24  ALT 18  ALKPHOS 44  BILITOT 0.6  PROT 6.9  ALBUMIN 3.9     Lipase     Component Value Date/Time   LIPASE 29 03/07/2015 0022     Studies/Results: Ct Abdomen Pelvis W Contrast  03/07/2015   CLINICAL DATA:  Lower abdominal pain beginning this morning.  EXAM: CT ABDOMEN AND PELVIS WITH CONTRAST  TECHNIQUE: Multidetector CT imaging of the abdomen and pelvis was performed using the standard protocol following bolus administration of intravenous contrast.  CONTRAST:  50mL OMNIPAQUE IOHEXOL 300 MG/ML SOLN, 100mL OMNIPAQUE IOHEXOL 300 MG/ML SOLN  COMPARISON:  None.  FINDINGS: LUNG BASES: Included view of the lung bases are clear. Visualized heart and pericardium are unremarkable.  SOLID ORGANS: The liver, spleen, gallbladder, pancreas and adrenal glands are unremarkable.   GASTROINTESTINAL TRACT: The stomach, small and large bowel are normal in course and caliber without inflammatory changes. 3 mm appendicolith, and the appendix is otherwise unremarkable. Enteric contrast has not yet reached the distal small bowel.  KIDNEYS/ URINARY TRACT: Kidneys are orthotopic, demonstrating symmetric enhancement. No nephrolithiasis, hydronephrosis or solid renal masses. The unopacified ureters are normal in course and caliber. Urinary bladder is partially distended and unremarkable.  PERITONEUM/RETROPERITONEUM: Aortoiliac vessels are normal in course and caliber. No lymphadenopathy by CT size criteria. Internal reproductive organs are unremarkable. No intraperitoneal free fluid nor free air.  SOFT TISSUE/OSSEOUS STRUCTURES: Non-suspicious. Moderate L5-S1 disc height loss, with endplate spurring most consistent with degenerative disc.  IMPRESSION: No acute intra-abdominal or pelvic process.   Electronically Signed   By: Awilda Metroourtnay  Bloomer   On: 03/07/2015 05:17    Medications: . enoxaparin (LOVENOX) injection  40 mg Subcutaneous Q24H  . 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 Off antibiotics Borderline renal insuffiencey  Ibuprofen 600 mg q6h Lovenox/DVT prophylaxis   Plan:  Recheck labs and decide on d/c treatment.      Casey Brooks 03/08/2015

## 2015-03-08 NOTE — Progress Notes (Signed)
UR completed 

## 2015-03-08 NOTE — Care Management Note (Signed)
    Page 1 of 1   03/08/2015     3:10:44 PM CARE MANAGEMENT NOTE 03/08/2015  Patient:  Casey Brooks,Casey Brooks   Account Number:  [id number]  Date Initiated:  03/08/2015  Documentation initiated by:  Lorenda IshiharaPEELE,Ananda Caya  Subjective/Objective Assessment:   39 yo male admitted s/p lap appy. PTA lived at home with parents.     Action/Plan:   Home when stable   Anticipated DC Date:  03/08/2015   Anticipated DC Plan:  HOME/SELF CARE      DC Planning Services  CM consult  PCP issues      Choice offered to / List presented to:             Status of service:  Completed, signed off Medicare Important Message given?   (If response is "NO", the following Medicare IM given date fields will be blank) Date Medicare IM given:   Medicare IM given by:   Date Additional Medicare IM given:   Additional Medicare IM given by:    Discharge Disposition:  HOME/SELF CARE  Per UR Regulation:  Reviewed for med. necessity/level of care/duration of stay  If discussed at Long Length of Stay Meetings, dates discussed:    Comments:  03-08-15 Lorenda IshiharaSuzanne Adisynn Suleiman RN CM 1500 Spoke with patient at bedside, no PCP currently, [facility]. Attempted to make appt for patient but unable to make contact. Provide patient with information on clinic and encouraged him to make hospital f/u appt.

## 2015-03-09 ENCOUNTER — Observation Stay (HOSPITAL_COMMUNITY): Payer: MEDICAID

## 2015-03-09 ENCOUNTER — Observation Stay (HOSPITAL_COMMUNITY): Payer: Self-pay

## 2015-03-09 DIAGNOSIS — F1910 Other psychoactive substance abuse, uncomplicated: Secondary | ICD-10-CM

## 2015-03-09 DIAGNOSIS — K37 Unspecified appendicitis: Secondary | ICD-10-CM

## 2015-03-09 DIAGNOSIS — R109 Unspecified abdominal pain: Secondary | ICD-10-CM

## 2015-03-09 DIAGNOSIS — I10 Essential (primary) hypertension: Secondary | ICD-10-CM

## 2015-03-09 LAB — URINALYSIS, ROUTINE W REFLEX MICROSCOPIC
Bilirubin Urine: NEGATIVE
Glucose, UA: NEGATIVE mg/dL
Hgb urine dipstick: NEGATIVE
Ketones, ur: NEGATIVE mg/dL
LEUKOCYTES UA: NEGATIVE
Nitrite: NEGATIVE
PH: 7 (ref 5.0–8.0)
PROTEIN: NEGATIVE mg/dL
Specific Gravity, Urine: 1.016 (ref 1.005–1.030)
Urobilinogen, UA: 1 mg/dL (ref 0.0–1.0)

## 2015-03-09 LAB — BASIC METABOLIC PANEL
Anion gap: 8 (ref 5–15)
BUN: 14 mg/dL (ref 6–23)
CALCIUM: 9.1 mg/dL (ref 8.4–10.5)
CO2: 30 mmol/L (ref 19–32)
Chloride: 103 mmol/L (ref 96–112)
Creatinine, Ser: 1.23 mg/dL (ref 0.50–1.35)
GFR calc Af Amer: 85 mL/min — ABNORMAL LOW (ref >90)
GFR, EST NON AFRICAN AMERICAN: 74 mL/min — AB (ref >90)
Glucose, Bld: 101 mg/dL — ABNORMAL HIGH (ref 70–99)
Potassium: 4.5 mmol/L (ref 3.5–5.1)
Sodium: 141 mmol/L (ref 135–145)

## 2015-03-09 LAB — RAPID URINE DRUG SCREEN, HOSP PERFORMED
AMPHETAMINES: NOT DETECTED
BARBITURATES: NOT DETECTED
Benzodiazepines: NOT DETECTED
Cocaine: POSITIVE — AB
Opiates: NOT DETECTED
TETRAHYDROCANNABINOL: NOT DETECTED

## 2015-03-09 LAB — CBC
HCT: 39.4 % (ref 39.0–52.0)
Hemoglobin: 13.1 g/dL (ref 13.0–17.0)
MCH: 29.8 pg (ref 26.0–34.0)
MCHC: 33.2 g/dL (ref 30.0–36.0)
MCV: 89.7 fL (ref 78.0–100.0)
Platelets: 363 10*3/uL (ref 150–400)
RBC: 4.39 MIL/uL (ref 4.22–5.81)
RDW: 13.6 % (ref 11.5–15.5)
WBC: 13.5 10*3/uL — ABNORMAL HIGH (ref 4.0–10.5)

## 2015-03-09 LAB — CK: CK TOTAL: 324 U/L — AB (ref 7–232)

## 2015-03-09 MED ORDER — AMPICILLIN-SULBACTAM SODIUM 3 (2-1) G IJ SOLR
3.0000 g | Freq: Four times a day (QID) | INTRAMUSCULAR | Status: DC
  Administered 2015-03-09 – 2015-03-10 (×5): 3 g via INTRAVENOUS
  Filled 2015-03-09 (×6): qty 3

## 2015-03-09 MED ORDER — HYDRALAZINE HCL 20 MG/ML IJ SOLN: 5.0000 mg | Freq: Four times a day (QID) | INTRAMUSCULAR | Status: DC | PRN

## 2015-03-09 NOTE — Progress Notes (Signed)
UR completed 

## 2015-03-09 NOTE — Progress Notes (Signed)
Patient ID: Casey Brooks, male   DOB: 11/29/1976, 39 y.o.   MRN: [id number] 2 Days Post-Op  Subjective: Still having some right lower quadrant abdominal pain, rated 6/10, relieved with pain medications. Also felt chills last night. No nausea or vomiting.  Objective: Vital signs in last 24 hours: Temp:  [98.3 F (36.8 C)-98.5 F (36.9 C)] 98.5 F (36.9 C) (03/15 0547) Pulse Rate:  [62-84] 84 (03/15 0547) Resp:  [17-20] 20 (03/15 0547) BP: (144-170)/(79-103) 170/103 mmHg (03/15 0547) SpO2:  [90 %-99 %] 90 % (03/15 0547) Last BM Date: 03/05/15  Intake/Output from previous day: 03/14 0701 - 03/15 0700 In: 480 [P.O.:480] Out: 0  Intake/Output this shift:    General appearance: alert, cooperative and no distress GI: abnormal findings:  moderate tenderness in the RLQ and with some guarding. Nondistended. Wounds clean.  Lab Results:   Recent Labs  03/08/15 1137 03/09/15 0446  WBC 12.1* 13.5*  HGB 12.0* 13.1  HCT 35.3* 39.4  PLT 352 363   BMET  Recent Labs  03/08/15 1137 03/09/15 0446  NA 141 141  K 4.0 4.5  CL 109 103  CO2 23 30  GLUCOSE 107* 101*  BUN 14 14  CREATININE 1.19 1.23  CALCIUM 8.8 9.1     Studies/Results: No results found.  Anti-infectives: Anti-infectives    Start     Dose/Rate Route Frequency Ordered Stop   03/07/15 0830  cefoTEtan (CEFOTAN) 2 g in dextrose 5 % 50 mL IVPB     2 g 100 mL/hr over 30 Minutes Intravenous On call to O.R. 03/07/15 0815 03/07/15 1010   03/07/15 0545  Ampicillin-Sulbactam (UNASYN) 3 g in sodium chloride 0.9 % 100 mL IVPB     3 g 100 mL/hr over 60 Minutes Intravenous  Once 03/07/15 0544 03/07/15 0700      Assessment/Plan: s/p Procedure(s): APPENDECTOMY LAPAROSCOPIC Working diagnosis of torsed appendix epiploica at the time of surgery. Patient has some persistent pain and tenderness and rising white count. I'm wondering if this could represent a cecal diverticulitis. With rising white count and chills I'm going  to go ahead and cover with antibiotics and watched in the hospital until his white count is decreasing. Hypertension. We'll ask medicine team to evaluate for possible treatment.      Phenix Vandermeulen T 03/09/2015

## 2015-03-09 NOTE — Progress Notes (Signed)
Physician text paged at patient mother request due to questions she has in reference to son condition.

## 2015-03-09 NOTE — Consult Note (Signed)
Medical Consultation   Casey Brooks  WUJ:[id number]  DOB: Apr 29, 1976  DOA: 03/06/2015  PCP: No primary care provider on file.  Requesting physician: Dr. Glenna Fellows, general surgery  Reason for consultation: Hypertension, creatinine, leukocytosis   History of Present Illness: 39 year old male with no past medical history presented to the emergency department on 03/07/2015 with complaints of right lower quadrant pain for 24 hours. Patient stated his pain was constant and worse with any type of movement. Patient was admitted by general surgery. He was found to have right lower quadrant tenderness to palpation with inflammation at the tip of his appendix per surgery. Patient did undergo laparoscopic appendectomy. Over the past several days, patient has continued to have leukocytosis and did experience chills overnight. His creatinine was 1.3 upon admission, currently 1.2. Patient has no history of kidney failure in the family. At this time, patient denies any urinary symptoms, did state he had some urinary retention yesterday. He denies chest pain, shortness of breath, cough, dizziness, diarrhea, nausea or vomiting. Patient does state he has not had a bowel movement since Friday, 03/05/2015. Patient does state he's had some gas passage. Patient states he's had mild pain at the surgical sites otherwise feels fine and is ready to go home. TRH was asked to consult due to patient's gross cytosis as well as accelerated hypertension.  Allergies:  No Known Allergies   History reviewed. No pertinent past medical history.  Past Surgical History  Procedure Laterality Date  . Laparoscopic appendectomy N/A 03/07/2015    Procedure: APPENDECTOMY LAPAROSCOPIC;  Surgeon: Romie Levee, MD;  Location: [facility];  Service: General;  Laterality: N/A;    Social History:  reports that he has been smoking.  He does not have any smokeless tobacco history on file. He reports that he drinks alcohol. He reports  that he uses illicit drugs (Cocaine and Marijuana).  Family history Mother has diabetes and hypertension.  Review of Systems:  Constitutional: Had chills overnight. HEENT: Denies photophobia, eye pain, redness, hearing loss, ear pain, congestion, sore throat, rhinorrhea, sneezing, mouth sores, trouble swallowing, neck pain, neck stiffness and tinnitus.   Respiratory: Denies SOB, DOE, cough, chest tightness,  and wheezing.   Cardiovascular: Denies chest pain, palpitations and leg swelling.  Gastrointestinal: Complains of abdominal pain.  Genitourinary: Denies dysuria, urgency, frequency, hematuria, flank pain and difficulty urinating.  Musculoskeletal: Denies myalgias, back pain, joint swelling, arthralgias and gait problem.  Skin: Denies pallor, rash and wound.  Neurological: Denies dizziness, seizures, syncope, weakness, light-headedness, numbness and headaches.  Hematological: Denies adenopathy. Easy bruising, personal or family bleeding history  Psychiatric/Behavioral: Denies suicidal ideation, mood changes, confusion, nervousness, sleep disturbance and agitation   Physical Exam: Blood pressure 123/62, pulse 91, temperature 98.5 F (36.9 C), temperature source Oral, resp. rate 20, height  (1.803 m), weight 81.647 kg (180 lb), SpO2 90 %.   General: Well developed, well nourished, NAD, appears stated age  HEENT: NCAT, PERRLA, EOMI, Anicteic Sclera, mucous membranes moist.   Neck: Supple, no JVD, no masses  Cardiovascular: S1 S2 auscultated, no murmurs, RRR  Respiratory: Clear to auscultation bilaterally with equal chest rise  Abdomen: Soft, RLQ TTP, nondistended, + bowel sounds, wounds-clean  Extremities: warm dry without cyanosis clubbing or edema  Neuro: AAOx3, cranial nerves grossly intact. Strength 5/5 in patient's upper and lower extremities bilaterally  Skin: Without rashes exudates or nodules  Psych: Normal affect and demeanor with intact judgement and  insight  Labs on Admission:  Basic Metabolic Panel:  Recent Labs Lab 03/08/15 1137 03/09/15 0446  NA 141 141  K 4.0 4.5  CL 109 103  CO2 23 30  GLUCOSE 107* 101*  BUN 14 14  CREATININE 1.19 1.23  CALCIUM 8.8 9.1   Liver Function Tests:  Recent Labs Lab 03/07/15 0022  AST 24  ALT 18  ALKPHOS 44  BILITOT 0.6  PROT 6.9  ALBUMIN 3.9    Recent Labs Lab 03/07/15 0022  LIPASE 29   No results for input(s): AMMONIA in the last 168 hours. CBC:  Recent Labs Lab 03/07/15 0022 03/08/15 1137 03/09/15 0446  WBC 11.6* 12.1* 13.5*  NEUTROABS 8.3*  --   --   HGB 13.8 12.0* 13.1  HCT 41.4 35.3* 39.4  MCV 89.8 88.0 89.7  PLT 338 352 363   Cardiac Enzymes: No results for input(s): CKTOTAL, CKMB, CKMBINDEX, TROPONINI in the last 168 hours. BNP: Invalid input(s): POCBNP CBG: No results for input(s): GLUCAP in the last 168 hours.  Inpatient Medications:   Scheduled Meds: . ampicillin-sulbactam (UNASYN) IV  3 g Intravenous Q6H  . enoxaparin (LOVENOX) injection  40 mg Subcutaneous Q24H  . ibuprofen  600 mg Oral Q6H   Continuous Infusions:    Radiological Exams on Admission: No results found.  Impression/Recommendations  Accelerated hypertension -Likely complicated by pain -Rechecked while in the room, blood pressure normalized to 123/62 however has been as high as 170/103 -Patient has no history of hypertension -At this time will not start medications, will continue to monitor  Leukocytosis -Patient status post arthroscopic and activity -Some urinary hesitancy 03/08/2015 as reported by patient however this morning patient has had no issues with urinary retention -Will check urinary analysis, chest x-ray -Could possibly be reactive versus infectious etiology -Will also check blood cultures -Surgery suspects this could be cecal diverticulitis, patient has been started on broad-spectrum antibiotics, Unasyn  Question of acute kidney injury -Upon admission,  creatinine was 1.3 -Currently 1.2, no history of kidney failure in the family -Will check urine analysis, urine creatinine/sodium/osm, renal ultrasound -Will also check CK level  Polysubstance abuse -Will check urine drug screen, patient has been counseled on cessation -Patient admits to alcohol, cocaine and tobacco use  Abdominal pain -Particularly in the right lower quadrant possibly secondary to recent appendicitis versus cecal diverticulitis -May consider repeat CT scan -Continue pain control and antibiotics  Thank you for the consultation allowing me to persist pain care and management of your patient. Will continue to follow with you.  Time Spent: 45 minutes  Billyjack Trompeter D.O. [facility] 03/09/2015, 9:59 AM

## 2015-03-10 ENCOUNTER — Encounter (HOSPITAL_COMMUNITY): Payer: Self-pay | Admitting: General Surgery

## 2015-03-10 DIAGNOSIS — K529 Noninfective gastroenteritis and colitis, unspecified: Secondary | ICD-10-CM

## 2015-03-10 DIAGNOSIS — K6389 Other specified diseases of intestine: Secondary | ICD-10-CM

## 2015-03-10 HISTORY — DX: Other specified diseases of intestine: K63.89

## 2015-03-10 LAB — CBC
HCT: 41.6 % (ref 39.0–52.0)
Hemoglobin: 14.1 g/dL (ref 13.0–17.0)
MCH: 29.9 pg (ref 26.0–34.0)
MCHC: 33.9 g/dL (ref 30.0–36.0)
MCV: 88.3 fL (ref 78.0–100.0)
Platelets: 348 10*3/uL (ref 150–400)
RBC: 4.71 MIL/uL (ref 4.22–5.81)
RDW: 13.4 % (ref 11.5–15.5)
WBC: 11.1 10*3/uL — AB (ref 4.0–10.5)

## 2015-03-10 LAB — BASIC METABOLIC PANEL
Anion gap: 12 (ref 5–15)
BUN: 12 mg/dL (ref 6–23)
CHLORIDE: 99 mmol/L (ref 96–112)
CO2: 26 mmol/L (ref 19–32)
Calcium: 9.2 mg/dL (ref 8.4–10.5)
Creatinine, Ser: 1.12 mg/dL (ref 0.50–1.35)
GFR calc non Af Amer: 82 mL/min — ABNORMAL LOW (ref >90)
Glucose, Bld: 105 mg/dL — ABNORMAL HIGH (ref 70–99)
POTASSIUM: 4.2 mmol/L (ref 3.5–5.1)
Sodium: 137 mmol/L (ref 135–145)

## 2015-03-10 LAB — URINE CULTURE
Colony Count: NO GROWTH
Culture: NO GROWTH

## 2015-03-10 LAB — CREATININE, URINE, RANDOM: CREATININE, URINE: 103.6 mg/dL

## 2015-03-10 LAB — SODIUM, URINE, RANDOM: Sodium, Ur: 117 mEq/L

## 2015-03-10 MED ORDER — POLYETHYLENE GLYCOL 3350 17 G PO PACK
17.0000 g | PACK | Freq: Once | ORAL | Status: AC
  Administered 2015-03-10: 17 g via ORAL
  Filled 2015-03-10: qty 1

## 2015-03-10 MED ORDER — SACCHAROMYCES BOULARDII 250 MG PO CAPS: ORAL_CAPSULE | ORAL | Status: AC

## 2015-03-10 MED ORDER — AMOXICILLIN-POT CLAVULANATE 875-125 MG PO TABS
1.0000 | ORAL_TABLET | Freq: Two times a day (BID) | ORAL | Status: DC
  Filled 2015-03-10 (×2): qty 1

## 2015-03-10 MED ORDER — AMOXICILLIN-POT CLAVULANATE 875-125 MG PO TABS: 1.0000 | ORAL_TABLET | Freq: Two times a day (BID) | ORAL | Status: AC

## 2015-03-10 MED ORDER — ACETAMINOPHEN 325 MG PO TABS: 650.0000 mg | ORAL_TABLET | Freq: Four times a day (QID) | ORAL | Status: AC | PRN

## 2015-03-10 MED ORDER — SACCHAROMYCES BOULARDII 250 MG PO CAPS
250.0000 mg | ORAL_CAPSULE | Freq: Two times a day (BID) | ORAL | Status: DC
  Filled 2015-03-10 (×2): qty 1

## 2015-03-10 MED ORDER — OXYCODONE-ACETAMINOPHEN 5-325 MG PO TABS: 1.0000 | ORAL_TABLET | ORAL | Status: DC | PRN

## 2015-03-10 NOTE — Discharge Instructions (Signed)
Laparoscopic Appendectomy °Care After °Refer to this sheet in the next few weeks. These instructions provide you with information on caring for yourself after your procedure. Your caregiver may also give you more specific instructions. Your treatment has been planned according to current medical practices, but problems sometimes occur. Call your caregiver if you have any problems or questions after your procedure. °HOME CARE INSTRUCTIONS °· Do not drive while taking narcotic pain medicines. °· Use stool softener if you become constipated from your pain medicines. °· Change your bandages (dressings) as directed. °· Keep your wounds clean and dry. You may wash the wounds gently with soap and water. Gently pat the wounds dry with a clean towel. °· Do not take baths, swim, or use hot tubs for 10 days, or as instructed by your caregiver. °· Only take over-the-counter or prescription medicines for pain, discomfort, or fever as directed by your caregiver. °· You may continue your normal diet as directed. °· Do not lift more than 10 pounds (4.5 kg) or play contact sports for 3 weeks, or as directed. °· Slowly increase your activity after surgery. °· Take deep breaths to avoid getting a lung infection (pneumonia). °SEEK MEDICAL CARE IF: °· You have redness, swelling, or increasing pain in your wounds. °· You have pus coming from your wounds. °· You have drainage from a wound that lasts longer than 1 day. °· You notice a bad smell coming from the wounds or dressing. °· Your wound edges break open after stitches (sutures) have been removed. °· You notice increasing pain in the shoulders (shoulder strap areas) or near your shoulder blades. °· You develop dizzy episodes or fainting while standing. °· You develop shortness of breath. °· You develop persistent nausea or vomiting. °· You cannot control your bowel functions or lose your appetite. °· You develop diarrhea. °SEEK IMMEDIATE MEDICAL CARE IF:  °· You have a fever. °· You  develop a rash. °· You have difficulty breathing or sharp pains in your chest. °· You develop any reaction or side effects to medicines given. °MAKE SURE YOU: °· Understand these instructions. °· Will watch your condition. °· Will get help right away if you are not doing well or get worse. °Document Released: 12/11/2005 Document Revised: 03/04/2012 Document Reviewed: 06/20/2011 °ExitCare® Patient Information ©2015 [employer]. This information is not intended to replace advice given to you by your health care provider. Make sure you discuss any questions you have with your health care provider. °[facility] ______[facility] °LAPAROSCOPIC SURGERY: POST OP INSTRUCTIONS °Always review your discharge instruction sheet given to you by the facility where your surgery was performed. °IF YOU HAVE DISABILITY OR FAMILY LEAVE FORMS, YOU MUST BRING THEM TO THE OFFICE FOR PROCESSING.   °DO NOT GIVE THEM TO YOUR DOCTOR. ° °1. A prescription for pain medication may be given to you upon discharge.  Take your pain medication as prescribed, if needed.  If narcotic pain medicine is not needed, then you may take acetaminophen (Tylenol) or ibuprofen (Advil) as needed. °2. Take your usually prescribed medications unless otherwise directed. °3. If you need a refill on your pain medication, please contact your pharmacy.  They will contact our office to request authorization. Prescriptions will not be filled after 5pm or on week-ends. °4. You should follow a light diet the first few days after arrival home, such as soup and crackers, etc.  Be sure to include lots of fluids daily. °5. Most patients will experience some swelling and bruising   in the area of the incisions.  Ice packs will help.  Swelling and bruising can take several days to resolve.  °6. It is common to experience some constipation if taking pain medication after surgery.  Increasing fluid intake and taking a stool softener (such as Colace) will usually help or  prevent this problem from occurring.  A mild laxative (Milk of Magnesia or Miralax) should be taken according to package instructions if there are no bowel movements after 48 hours. °7. Unless discharge instructions indicate otherwise, you may remove your bandages 24-48 hours after surgery, and you may shower at that time.  You may have steri-strips (small skin tapes) in place directly over the incision.  These strips should be left on the skin for 7-10 days.  If your surgeon used skin glue on the incision, you may shower in 24 hours.  The glue will flake off over the next 2-3 weeks.  Any sutures or staples will be removed at the office during your follow-up visit. °8. ACTIVITIES:  You may resume regular (light) daily activities beginning the next day--such as daily self-care, walking, climbing stairs--gradually increasing activities as tolerated.  You may have sexual intercourse when it is comfortable.  Refrain from any heavy lifting or straining until approved by your doctor. °a. You may drive when you are no longer taking prescription pain medication, you can comfortably wear a seatbelt, and you can safely maneuver your car and apply brakes. °b. RETURN TO WORK:  __________________________________________________________ °9. You should see your doctor in the office for a follow-up appointment approximately 2-3 weeks after your surgery.  Make sure that you call for this appointment within a day or two after you arrive home to insure a convenient appointment time. °10. OTHER INSTRUCTIONS: __________________________________________________________________________________________________________________________ __________________________________________________________________________________________________________________________ °WHEN TO CALL YOUR DOCTOR: °1. Fever over 101.0 °2. Inability to urinate °3. Continued bleeding from incision. °4. Increased pain, redness, or drainage from the incision. °5. Increasing  abdominal pain ° °The clinic staff is available to answer your questions during regular business hours.  Please don’t hesitate to call and ask to speak to one of the nurses for clinical concerns.  If you have a medical emergency, go to the nearest emergency room or call 911.  A surgeon from [facility] is always on call at the hospital. °[address], [address], [place], [place]  [address] ? [address] [address], [place], [place]   [address] °[phone] ? [phone] ? FAX [phone] °Web site: [email or web address] ° °

## 2015-03-10 NOTE — Progress Notes (Signed)
3 Days Post-Op  Subjective: Complains of significant pain last PM.  Pain better with percocet, but still an issue for him.    Objective: Vital signs in last 24 hours: Temp:  [98.2 F (36.8 C)-99.6 F (37.6 C)] 98.3 F (36.8 C) (03/16 0532) Pulse Rate:  [78-97] 78 (03/16 0532) Resp:  [18] 18 (03/16 0532) BP: (114-161)/(63-89) 114/63 mmHg (03/16 0532) SpO2:  [97 %-100 %] 97 % (03/16 0532) Last BM Date: 03/05/15 2280 PO yesterday No Bm since admission Tm 99.6 No further hypertension recorded WBC and creatinine are improving. Intake/Output from previous day: 03/15 0701 - 03/16 0700 In: 2580 [P.O.:2280; IV Piggyback:300] Out: 1400 [Urine:1400] Intake/Output this shift:    General appearance: alert, cooperative and no distress Resp: clear to auscultation bilaterally GI: soft, minimal tenderness, full breakfast in front of him.  +BS Port sites look fine   Lab Results:   Recent Labs  03/09/15 0446 03/10/15 0515  WBC 13.5* 11.1*  HGB 13.1 14.1  HCT 39.4 41.6  PLT 363 348    BMET  Recent Labs  03/09/15 0446 03/10/15 0515  NA 141 137  K 4.5 4.2  CL 103 99  CO2 30 26  GLUCOSE 101* 105*  BUN 14 12  CREATININE 1.23 1.12  CALCIUM 9.1 9.2   PT/INR No results for input(s): LABPROT, INR in the last 72 hours.   Recent Labs Lab 03/07/15 0022  AST 24  ALT 18  ALKPHOS 44  BILITOT 0.6  PROT 6.9  ALBUMIN 3.9     Lipase     Component Value Date/Time   LIPASE 29 03/07/2015 0022     Studies/Results: Dg Chest 2 View  03/09/2015   CLINICAL DATA:  Leukocytosis.  Status post appendectomy.  Smoker.  EXAM: CHEST  2 VIEW  COMPARISON:  None.  FINDINGS: The heart size and mediastinal contours are within normal limits. Both lungs are clear. The visualized skeletal structures are unremarkable.  IMPRESSION: Normal examination.   Electronically Signed   By: Beckie SaltsSteven  Reid M.D.   On: 03/09/2015 13:27   [place] Renal  03/09/2015   CLINICAL DATA:  Acute kidney injury.  EXAM:  RENAL/URINARY TRACT ULTRASOUND COMPLETE  COMPARISON:  Abdomen and pelvis CT dated 03/07/2015.  FINDINGS: Right Kidney:  Length: 12.1 cm. Echogenicity within normal limits. No mass or hydronephrosis visualized.  Left Kidney:  Length: 11.5 cm. Echogenicity within normal limits. No mass or hydronephrosis visualized.  Bladder:  Appears normal for degree of bladder distention.  IMPRESSION: Normal examination.   Electronically Signed   By: Beckie SaltsSteven  Reid M.D.   On: 03/09/2015 13:26    Medications: . ampicillin-sulbactam (UNASYN) IV  3 g Intravenous Q6H  . enoxaparin (LOVENOX) injection  40 mg Subcutaneous Q24H    Assessment/Plan Probable epiploic appendagitis; S/P LAPAROSCOPIC APPENDECTIOMY hypertension Cocaine, ETOH and Marijuana use. Borderline renal insuffiencey  Lovenox/DVT prophylaxis  Plan:  We are hoping to let him go home today.  I don't want to add NSAID  Even with renal function improving.  He says he can stay off cocaine.  I will give him a laxative and will discuss with Dr. Johna SheriffHoxworth later.        LOS: 1 day    Latiya Navia 03/10/2015

## 2015-03-10 NOTE — Progress Notes (Signed)
Nurse reviewed discharge instructions with pt.  Pt verbalized understanding of discharge instructions, follow up appointments and new medications.  No concerns at time of discharge.  Prescription given prior to discharge.

## 2015-03-10 NOTE — Discharge Summary (Signed)
Physician Discharge Summary  Patient ID: Casey Brooks MRN: [id number] DOB/AGE: May 10, 1976 39 y.o.  Admit date: 03/06/2015 Discharge date: 03/10/2015  Admission Diagnoses:  Appendicitis  Discharge Diagnoses:  Probable epiploic appendagitis  Hypertension Borderline renal insuffiencey Hx of ETOH, Marijuana, and cocaine use  Active Problems:   Appendicitis   Epiploic appendagitis   PROCEDURES: Laparoscopic Appendectomy 03/07/15  Hospital Course:  Pt complains of RLQ pain since yesterday. He denies any changes in bowel habits, fevers or nausea. Pain is constant and worse with movement. He denies any urinary symptoms or flank pain. He was admitted by Dr. Maisie Fus and taken to the OR for appendicitis Findings:  The cecum was adherent to the abdominal wall with the appendix associated with this but it did not appear to be the cause of the inflammation. This was taken down bluntly and appeared to be most consistent with appendagitis. Given the proximity to the appendix, it was decided to perform an appendectomy.  Post op he was still having some pain.  Dr. Maisie Fus wanted to treat Appendagitis with NSAID'S.  I noted his admit creatinine was elevated to 1.31, and we rechecked it.  Creatinine was better but WBC was up so we kept him overnight to be sure everything was trending properly.  He was still having some pain, but looked good.  He had a single elevated BP up to 170/103, with follow up BP 161/89.  With elevated creatinine we ask Medicine to see and evaluate, he had no medical issues before this.  He does use ETOH, cocaine, and Marijuana. His BP came down, and creatinine remained slight up at 1.23.  WBC was up slightly more at 13.5.  He was started on antibiotics at this point and the following day his labs all look better.  NSAIDS were also stopped and his creatinine continues to improve.  Today labs are better. He complained of pain overnight, but looks fine and we are ready for him to go  home.  He has follow up appointment with Medicine for later this week to follow up on BP and renal function.  We discussed his substance use and he thinks he can control this.  We are giving him another week of Augmentin antibiotic treatment.  He will follow up with our office and Dr. Maisie Fus in 2 weeks.  Condition on D/C:  Improved.  CMP Latest Ref Rng 03/10/2015 03/09/2015 03/08/2015  Glucose 70 - 99 mg/dL 409(W) 119(J) 478(G)  BUN 6 - 23 mg/dL Creatinine 0.50 - 1.35 mg/dL 9.56 2.13 0.86  Sodium 135 - 145 mmol/L 137 141 141  Potassium 3.5 - 5.1 mmol/L 4.2 4.5 4.0  Chloride 96 - 112 mmol/L 99 103 109  CO2 19 - 32 mmol/L Calcium 8.4 - 10.5 mg/dL 9.2 9.1 8.8  Total Protein 6.0 - 8.3 g/dL - - -  Total Bilirubin 0.3 - 1.2 mg/dL - - -  Alkaline Phos 39 - 117 U/L - - -  AST 0 - 37 U/L - - -  ALT 0 - 53 U/L - - -   CBC Latest Ref Rng 03/10/2015 03/09/2015 03/08/2015  WBC 4.0 - 10.5 K/uL 11.1(H) 13.5(H) 12.1(H)  Hemoglobin 13.0 - 17.0 g/dL 57.8 46.9 12.0(L)  Hematocrit 39.0 - 52.0 % 41.6 39.4 35.3(L)  Platelets 150 - 400 K/uL 348 363 352     Disposition: Final discharge disposition not confirmed     Medication List    TAKE these medications  acetaminophen 325 MG tablet  Commonly known as:  TYLENOL  Take 2 tablets (650 mg total) by mouth every 6 (six) hours as needed (additional pain medicine.  See warning below). You can take plain Tylenol (acetaminophen) for pain also.   You can take 2 every 6 hours as needed.  You can only take 4000 mg of acetaminophen per day and this is in your prescribed pain medicine so you have to count it.     amoxicillin-clavulanate 875-125 MG per tablet  Commonly known as:  AUGMENTIN  Take 1 tablet by mouth every 12 (twelve) hours.     oxyCODONE-acetaminophen 5-325 MG per tablet  Commonly known as:  PERCOCET/ROXICET  Take 1-2 tablets by mouth every 4 (four) hours as needed for moderate pain.     saccharomyces boulardii 250 MG  capsule  Commonly known as:  FLORASTOR  Take one twice a day for at least 1 week after you finish antibiotics.  You can buy this over the counter at any drugstore/[employer] type store.       Follow-up Information    Follow up with  [facility]    . Go on 03/11/2015.   Why:  appointment at 11:15 am, arrive 15 min prior to appointment   Contact information:   [address] [place] [phone]      Follow up with Vanita PandaHOMAS, ALICIA C., MD. Schedule an appointment as soon as possible for a visit in 2 weeks.   Specialty:  General Surgery   Contact information:   [address] [place] [id number] [phone]       Signed: Sherrie GeorgeJENNINGS,Marleena Shubert 03/10/2015, 1:26 PM

## 2015-03-11 ENCOUNTER — Ambulatory Visit: Payer: Self-pay | Attending: Family Medicine | Admitting: Family Medicine

## 2015-03-11 ENCOUNTER — Encounter: Payer: Self-pay | Admitting: Family Medicine

## 2015-03-11 VITALS — BP 111/70 | HR 78 | Temp 99.0°F | Resp 16 | Ht 71.0 in | Wt 172.0 lb

## 2015-03-11 DIAGNOSIS — Z9889 Other specified postprocedural states: Secondary | ICD-10-CM

## 2015-03-11 DIAGNOSIS — Z09 Encounter for follow-up examination after completed treatment for conditions other than malignant neoplasm: Secondary | ICD-10-CM | POA: Insufficient documentation

## 2015-03-11 DIAGNOSIS — Z8719 Personal history of other diseases of the digestive system: Secondary | ICD-10-CM | POA: Insufficient documentation

## 2015-03-11 DIAGNOSIS — Z9049 Acquired absence of other specified parts of digestive tract: Secondary | ICD-10-CM

## 2015-03-11 NOTE — Progress Notes (Signed)
Patient reports he is doing well after his apendectomy-pain controlled on his analgesics.  Patient requesting HIV test and financial counseling for [facility] discount    Admit date: 03/06/2015 Discharge date: 03/10/2015  Admission Diagnoses:  Appendicitis  Discharge Diagnoses:  Probable epiploic appendagitis  Hypertension Borderline renal insuffiencey Hx of ETOH, Marijuana, and cocaine use  Active Problems:  Appendicitis  Epiploic appendagitis   PROCEDURES: Laparoscopic Appendectomy 03/07/15  Hospital Course: Pt complains of RLQ pain since yesterday. He denies any changes in bowel habits, fevers or nausea. Pain is constant and worse with movement. He denies any urinary symptoms or flank pain. He was admitted by Dr. Maisie Fushomas and taken to the OR for appendicitis Findings: The cecum was adherent to the abdominal wall with the appendix associated with this but it did not appear to be the cause of the inflammation. This was taken down bluntly and appeared to be most consistent with appendagitis. Given the proximity to the appendix, it was decided to perform an appendectomy.  Post op he was still having some pain. Dr. Maisie Fushomas wanted to treat Appendagitis with NSAID'S. I noted his admit creatinine was elevated to 1.31, and we rechecked it. Creatinine was better but WBC was up so we kept him overnight to be sure everything was trending properly. He was still having some pain, but looked good. He had a single elevated BP up to 170/103, with follow up BP 161/89. With elevated creatinine we ask Medicine to see and evaluate, he had no medical issues before this. He does use ETOH, cocaine, and Marijuana. His BP came down, and creatinine remained slight up at 1.23. WBC was up slightly more at 13.5. He was started on antibiotics at this point and the following day his labs all look better. NSAIDS were also stopped and his creatinine continues to improve. Today labs are better. He  complained of pain overnight, but looks fine and we are ready for him to go home. He has follow up appointment with Medicine for later this week to follow up on BP and renal function. We discussed his substance use and he thinks he can control this. We are giving him another week of Augmentin antibiotic treatment. He will follow up with our office and Dr. Maisie Fushomas in 2 weeks.

## 2015-03-11 NOTE — Progress Notes (Signed)
Patient ID: Casey Brooks, male   DOB: 17-Mar-1976, 39 y.o.   MRN: [id number] [facility] Complaint: Hospital follow-up, appendicitis Subjective:  Patient presents for follow-up hospital visit for appendicitis. He had surgery on March 13 (4 days ago).He has a follow-up appointment with surgeon in two weeks. The visit today is to establish care and discuss financial aid. He denies chronic illness and was on no medications prior to this episode.   ROS:  GEN:   Denies fever, chills,WT loss Skin:   Denies lesions or rashes HENT:   Denies  earache, epistaxis, sore throat, or neck pain, or headaches                LUNGS:  Denies SOB with rest or walking CV:   Denies CP or palpitations ABD:   Denies abdominal pain, nausea,and vomiting            EXT:    Denies muscle spasms or swelling; no pain in lower ext, no weakness NEURO:   Denies numbness or tingling, denies sz, Hx of stroke.  Objective:  Filed Vitals:   03/11/15 1057  BP: 111/70  Pulse: 78  Temp: 99 F (37.2 C)  Resp: 16  Height:  (1.803 m)  Weight: 172 lb (78.019 kg)  SpO2: 99%    Physical Exam:  General:  in no acute distress.Skin warm and dry,  HEENT:  no pallor, no icterus, moist oral mucosa, no  lymphadenopathy Heart:   Normal  s1 &s2  Regular rate and rhythm, without M,G,R Lungs:   Clear to auscultation bilaterally. Abdomen:  Soft, nondistended, positive bowel sounds.There are three surgical scar from laproscopic surgery. There is tenderness to palpation Exetremeties:  No pedal edema,pedal pulses normal. Neuro:   Alert, awake, oriented x3, nonfocal.  Pertinent Lab Results:   Medications: Prior to Admission medications   Medication Sig Start Date End Date Taking? Authorizing Provider  amoxicillin-clavulanate (AUGMENTIN) 875-125 MG per tablet Take 1 tablet by mouth every 12 (twelve) hours. 03/10/15  Yes Sherrie George, PA-C  oxyCODONE-acetaminophen (PERCOCET/ROXICET) 5-325 MG per tablet Take 1-2 tablets by mouth  every 4 (four) hours as needed for moderate pain. 03/10/15  Yes Sherrie George, PA-C  acetaminophen (TYLENOL) 325 MG tablet Take 2 tablets (650 mg total) by mouth every 6 (six) hours as needed (additional pain medicine.  See warning below). You can take plain Tylenol (acetaminophen) for pain also.   You can take 2 every 6 hours as needed.  You can only take 4000 mg of acetaminophen per day and this is in your prescribed pain medicine so you have to count it. Patient not taking: Reported on 03/11/2015 03/10/15   Sherrie George, PA-C  saccharomyces boulardii (FLORASTOR) 250 MG capsule Take one twice a day for at least 1 week after you finish antibiotics.  You can buy this over the counter at any drugstore/[employer] type store. Patient not taking: Reported on 03/11/2015 03/10/15   Sherrie George, PA-C    Assessment: 1.Hospital follow-up, appendicitis     Plan: Follow up with surgeon Calll with our financial people F/U visit with assigned primary doctor here in one month  Follow up:  The patient was given clear instructions to go to ER or return to medical center if symptoms don't improve, worsen or new problems develop. The patient verbalized understanding. The patient was told to call to get lab results if they haven't heard anything in the next week.   This note has been created with Teaching laboratory technician and  smart Lobbyistphrase technology. Any transcriptional errors are unintentional.   Henrietta HooverLinda C. Kinley Dozier, FNP,BC 03/11/2015, 11:01 AM

## 2015-03-11 NOTE — Patient Instructions (Signed)
1.  Follow- instructions from hospital as to home care 2.  Follow-up with surgeon as planned 3.  Talk with our financial people about [facility] card or [facility]. 4.  Follow-up with assined primary doctor here in 4 weeks. Return prn for problems prior to that

## 2015-03-13 ENCOUNTER — Emergency Department (HOSPITAL_COMMUNITY)
Admission: EM | Admit: 2015-03-13 | Discharge: 2015-03-14 | Disposition: A | Discharge status: Home / Self Care | Payer: Self-pay | Attending: Emergency Medicine | Admitting: Emergency Medicine

## 2015-03-13 ENCOUNTER — Encounter (HOSPITAL_COMMUNITY): Payer: Self-pay | Admitting: Emergency Medicine

## 2015-03-13 DIAGNOSIS — Z8719 Personal history of other diseases of the digestive system: Secondary | ICD-10-CM | POA: Insufficient documentation

## 2015-03-13 DIAGNOSIS — Z792 Long term (current) use of antibiotics: Secondary | ICD-10-CM | POA: Insufficient documentation

## 2015-03-13 DIAGNOSIS — R112 Nausea with vomiting, unspecified: Secondary | ICD-10-CM | POA: Insufficient documentation

## 2015-03-13 DIAGNOSIS — Z79899 Other long term (current) drug therapy: Secondary | ICD-10-CM | POA: Insufficient documentation

## 2015-03-13 DIAGNOSIS — Z720 Tobacco use: Secondary | ICD-10-CM | POA: Insufficient documentation

## 2015-03-13 DIAGNOSIS — R1031 Right lower quadrant pain: Secondary | ICD-10-CM | POA: Insufficient documentation

## 2015-03-13 DIAGNOSIS — R140 Abdominal distension (gaseous): Secondary | ICD-10-CM | POA: Insufficient documentation

## 2015-03-13 NOTE — ED Notes (Signed)
Pt c/o mid abdominal pain around incision area. Denies diarrhea. Emesis x1 today-denies blood in vomit. S/p appendectomy. Ate protein shake today and vomited after eating shake with "old bananas." Patient is guarding abdomen and reports tenderness around incision. No redness or drainage noted around incision. Took pain medicine (can't recall name) and antibiotic around 2030. No other c/c.

## 2015-03-13 NOTE — ED Notes (Signed)
Pt c/o lower abd pain onset 3/16 upon d/c from [facility] s/p appe. Per EMS pt has just consumed large protein shake with black bananas then vomited contents. Pt ambulatory to triage.

## 2015-03-14 ENCOUNTER — Emergency Department (HOSPITAL_COMMUNITY): Payer: Self-pay

## 2015-03-14 LAB — CBC WITH DIFFERENTIAL/PLATELET
BASOS PCT: 1 % (ref 0–1)
Basophils Absolute: 0.1 10*3/uL (ref 0.0–0.1)
Eosinophils Absolute: 0.2 10*3/uL (ref 0.0–0.7)
Eosinophils Relative: 3 % (ref 0–5)
HEMATOCRIT: 39.6 % (ref 39.0–52.0)
HEMOGLOBIN: 13.4 g/dL (ref 13.0–17.0)
LYMPHS ABS: 2.5 10*3/uL (ref 0.7–4.0)
Lymphocytes Relative: 31 % (ref 12–46)
MCH: 29.9 pg (ref 26.0–34.0)
MCHC: 33.8 g/dL (ref 30.0–36.0)
MCV: 88.4 fL (ref 78.0–100.0)
Monocytes Absolute: 0.6 10*3/uL (ref 0.1–1.0)
Monocytes Relative: 8 % (ref 3–12)
NEUTROS ABS: 4.7 10*3/uL (ref 1.7–7.7)
Neutrophils Relative %: 57 % (ref 43–77)
Platelets: 379 10*3/uL (ref 150–400)
RBC: 4.48 MIL/uL (ref 4.22–5.81)
RDW: 13.3 % (ref 11.5–15.5)
WBC: 8.1 10*3/uL (ref 4.0–10.5)

## 2015-03-14 LAB — COMPREHENSIVE METABOLIC PANEL
ALK PHOS: 41 U/L (ref 39–117)
ALT: 27 U/L (ref 0–53)
AST: 26 U/L (ref 0–37)
Albumin: 3.9 g/dL (ref 3.5–5.2)
Anion gap: 6 (ref 5–15)
BILIRUBIN TOTAL: 0.4 mg/dL (ref 0.3–1.2)
BUN: 18 mg/dL (ref 6–23)
CO2: 26 mmol/L (ref 19–32)
Calcium: 9.1 mg/dL (ref 8.4–10.5)
Chloride: 105 mmol/L (ref 96–112)
Creatinine, Ser: 1.06 mg/dL (ref 0.50–1.35)
GFR calc Af Amer: >90 mL/min (ref >90)
GFR calc non Af Amer: 88 mL/min — ABNORMAL LOW (ref >90)
GLUCOSE: 111 mg/dL — AB (ref 70–99)
POTASSIUM: 4.5 mmol/L (ref 3.5–5.1)
Sodium: 137 mmol/L (ref 135–145)
TOTAL PROTEIN: 7.2 g/dL (ref 6.0–8.3)

## 2015-03-14 LAB — URINALYSIS, ROUTINE W REFLEX MICROSCOPIC
Bilirubin Urine: NEGATIVE
Glucose, UA: NEGATIVE mg/dL
Hgb urine dipstick: NEGATIVE
Ketones, ur: NEGATIVE mg/dL
LEUKOCYTES UA: NEGATIVE
Nitrite: NEGATIVE
Protein, ur: NEGATIVE mg/dL
Specific Gravity, Urine: >1.046 — ABNORMAL HIGH (ref 1.005–1.030)
UROBILINOGEN UA: 0.2 mg/dL (ref 0.0–1.0)
pH: 7 (ref 5.0–8.0)

## 2015-03-14 LAB — LIPASE, BLOOD: Lipase: 19 U/L (ref 11–59)

## 2015-03-14 MED ORDER — SODIUM CHLORIDE 0.9 % IV BOLUS (SEPSIS)
1000.0000 mL | Freq: Once | INTRAVENOUS | Status: AC
  Administered 2015-03-14: 1000 mL via INTRAVENOUS

## 2015-03-14 MED ORDER — IOHEXOL 300 MG/ML  SOLN
100.0000 mL | Freq: Once | INTRAMUSCULAR | Status: AC | PRN
  Administered 2015-03-14: 100 mL via INTRAVENOUS

## 2015-03-14 MED ORDER — HYDROMORPHONE HCL 1 MG/ML IJ SOLN
1.0000 mg | Freq: Once | INTRAMUSCULAR | Status: AC
  Administered 2015-03-14: 1 mg via INTRAVENOUS
  Filled 2015-03-14: qty 1

## 2015-03-14 MED ORDER — PROMETHAZINE HCL 25 MG PO TABS: 25.0000 mg | ORAL_TABLET | Freq: Four times a day (QID) | ORAL | Status: AC | PRN

## 2015-03-14 MED ORDER — IOHEXOL 300 MG/ML  SOLN
50.0000 mL | Freq: Once | INTRAMUSCULAR | Status: AC | PRN
  Administered 2015-03-14: 50 mL via ORAL

## 2015-03-14 MED ORDER — OXYCODONE-ACETAMINOPHEN 5-325 MG PO TABS: 2.0000 | ORAL_TABLET | ORAL | Status: AC | PRN

## 2015-03-14 MED ORDER — KETOROLAC TROMETHAMINE 30 MG/ML IJ SOLN
30.0000 mg | Freq: Once | INTRAMUSCULAR | Status: AC
  Administered 2015-03-14: 30 mg via INTRAVENOUS
  Filled 2015-03-14: qty 1

## 2015-03-14 MED ORDER — ONDANSETRON HCL 4 MG/2ML IJ SOLN
4.0000 mg | Freq: Once | INTRAMUSCULAR | Status: AC
  Administered 2015-03-14: 4 mg via INTRAVENOUS
  Filled 2015-03-14: qty 2

## 2015-03-14 MED ORDER — MORPHINE SULFATE 4 MG/ML IJ SOLN
4.0000 mg | Freq: Once | INTRAMUSCULAR | Status: AC
  Administered 2015-03-14: 4 mg via INTRAVENOUS
  Filled 2015-03-14: qty 1

## 2015-03-14 NOTE — Discharge Instructions (Signed)
Take Percocet as needed for pain. Take phenergan as needed for nausea. Refer to attached documents for more information. Follow up with your surgeon for further evaluation.

## 2015-03-14 NOTE — ED Provider Notes (Signed)
CSN: [id number]     Arrival date & time 03/13/15  2255 History   First MD Initiated Contact with Patient 03/14/15 0031     Chief Complaint  Patient presents with  . Abdominal Pain  . Emesis  . Nausea     (Consider location/radiation/quality/duration/timing/severity/associated sxs/prior Treatment) HPI Comments: Patient is a 39 year old male who is 6 days s/p appendectomy who presents with abdominal pain that has been continuous since the surgery but acutely worsened today. The pain is located "all over" the abdomen but mostly concentrated in the RLQ and does not radiate. The pain is described as aching and severe. The pain started gradually and progressively worsened since the onset. No alleviating/aggravating factors. The patient has tried nothing for symptoms without relief. Associated symptoms include nausea and vomiting. Patient denies fever, headache, diarrhea, chest pain, SOB, dysuria. Patient reports normal, daily bowel movements since the surgery.    Patient is a 39 y.o. male presenting with abdominal pain and vomiting.  Abdominal Pain Associated symptoms: nausea and vomiting   Associated symptoms: no chest pain, no chills, no diarrhea, no dysuria, no fatigue, no fever and no shortness of breath   Emesis Associated symptoms: abdominal pain   Associated symptoms: no arthralgias, no chills and no diarrhea     Past Medical History  Diagnosis Date  . Epiploic appendagitis 03/10/2015   Past Surgical History  Procedure Laterality Date  . Laparoscopic appendectomy N/A 03/07/2015    Procedure: APPENDECTOMY LAPAROSCOPIC;  Surgeon: Romie Levee, MD;  Location: [facility];  Service: General;  Laterality: N/A;   Family History  Problem Relation Age of Onset  . Diabetes Mother   . Heart disease Mother   . Hypertension Mother   . Hypertension Father    History  Substance Use Topics  . Smoking status: Current Every Day Smoker -- 0.50 packs/day  . Smokeless tobacco: Not on file  .  Alcohol Use: 0.0 oz/week    0 Standard drinks or equivalent per week     Comment: sometimes    Review of Systems  Constitutional: Negative for fever, chills and fatigue.  HENT: Negative for trouble swallowing.   Eyes: Negative for visual disturbance.  Respiratory: Negative for shortness of breath.   Cardiovascular: Negative for chest pain and palpitations.  Gastrointestinal: Positive for nausea, vomiting and abdominal pain. Negative for diarrhea.  Genitourinary: Negative for dysuria and difficulty urinating.  Musculoskeletal: Negative for arthralgias and neck pain.  Skin: Negative for color change.  Neurological: Negative for dizziness and weakness.  Psychiatric/Behavioral: Negative for dysphoric mood.      Allergies  Review of patient's allergies indicates no known allergies.  Home Medications   Prior to Admission medications   Medication Sig Start Date End Date Taking? Authorizing Provider  amoxicillin-clavulanate (AUGMENTIN) 875-125 MG per tablet Take 1 tablet by mouth every 12 (twelve) hours. 03/10/15  Yes Sherrie George, PA-C  Ascorbic Acid (VITAMIN C PO) Take 1 tablet by mouth daily.   Yes Historical Provider, MD  Multiple Vitamin (MULTIVITAMIN WITH MINERALS) TABS tablet Take 1 tablet by mouth daily.   Yes Historical Provider, MD  oxyCODONE-acetaminophen (PERCOCET/ROXICET) 5-325 MG per tablet Take 1-2 tablets by mouth every 4 (four) hours as needed for moderate pain. 03/10/15  Yes Sherrie George, PA-C  VITAMIN D, CHOLECALCIFEROL, PO Take 1 tablet by mouth daily.   Yes Historical Provider, MD  acetaminophen (TYLENOL) 325 MG tablet Take 2 tablets (650 mg total) by mouth every 6 (six) hours as needed (additional pain medicine.  See warning below). You can take plain Tylenol (acetaminophen) for pain also.   You can take 2 every 6 hours as needed.  You can only take 4000 mg of acetaminophen per day and this is in your prescribed pain medicine so you have to count it. Patient not  taking: Reported on 03/11/2015 03/10/15   Sherrie George, PA-C  saccharomyces boulardii (FLORASTOR) 250 MG capsule Take one twice a day for at least 1 week after you finish antibiotics.  You can buy this over the counter at any drugstore/[employer] type store. Patient not taking: Reported on 03/11/2015 03/10/15   Sherrie George, PA-C   BP 173/96 mmHg  Pulse 72  Temp(Src) 98.3 F (36.8 C) (Oral)  Resp 18  SpO2 99% Physical Exam  Constitutional: He is oriented to person, place, and time. He appears well-developed and well-nourished. No distress.  HENT:  Head: Normocephalic and atraumatic.  Eyes: Conjunctivae and EOM are normal.  Neck: Normal range of motion.  Cardiovascular: Normal rate and regular rhythm.  Exam reveals no gallop and no friction rub.   No murmur heard. Pulmonary/Chest: Effort normal and breath sounds normal. He has no wheezes. He has no rales. He exhibits no tenderness.  Abdominal: Soft. He exhibits distension. There is tenderness. There is no rebound and no guarding.  Mild distension of abdomen. Generalized tenderness to palpation with exquisite tenderness in the RLQ.   Musculoskeletal: Normal range of motion.  Neurological: He is alert and oriented to person, place, and time. Coordination normal.  Speech is goal-oriented. Moves limbs without ataxia.   Skin: Skin is warm and dry.  Psychiatric: He has a normal mood and affect. His behavior is normal.  Nursing note and vitals reviewed.   ED Course  Procedures (including critical care time) Labs Review Labs Reviewed  COMPREHENSIVE METABOLIC PANEL - Abnormal; Notable for the following:    Glucose, Bld 111 (*)    GFR calc non Af Amer 88 (*)    All other components within normal limits  CBC WITH DIFFERENTIAL/PLATELET  LIPASE, BLOOD  URINALYSIS, ROUTINE W REFLEX MICROSCOPIC    Imaging Review Ct Abdomen Pelvis W Contrast  03/14/2015   CLINICAL DATA:  Lower abdominal pain.  EXAM: CT ABDOMEN AND PELVIS WITH  CONTRAST  TECHNIQUE: Multidetector CT imaging of the abdomen and pelvis was performed using the standard protocol following bolus administration of intravenous contrast.  CONTRAST:  50mL OMNIPAQUE IOHEXOL 300 MG/ML SOLN, OMNIPAQUE IOHEXOL 300 MG/ML SOLN  COMPARISON:  03/07/2015  FINDINGS: There are normal appearances of the liver, spleen, pancreas, adrenals and kidneys. There is no bowel obstruction or perforation. No focal inflammatory changes are evident. There is recent appendectomy. There is a tiny puddle of fluid in the right pericolic gutter and a trace fluid in the dependent pelvic peritoneal recesses, probably postoperative in nature. There is no evidence of a contained fluid collection or abscess. There are no significant abnormalities in the lower chest. No significant musculoskeletal abnormality.  IMPRESSION: Tiny fluid collections in the right pericolic gutter and dependent pelvic peritoneum, likely postoperative in nature. No evidence of abscess, extraluminal air, or focal inflammatory change.   Electronically Signed   By: Ellery Plunk M.D.   On: 03/14/2015 03:00     EKG Interpretation None      MDM   Final diagnoses:  Right lower quadrant abdominal pain   1:13 AM Labs unremarkable for acute changes. Patient will have morphine and zofran for symptoms. Patient will have a CT abdomen pelvis. Vitals  stable and patient afebrile.   4:35 AM CT shows post surgical changes without signs of infection. Patient reports improvement of symptoms. Patient will be discharged with Percocet and phenergan. Vitals stable and patient afebrile. Patient instructed to return with worsening or concerning symptoms.   [address] LaneKaitlyn OglethorpeSzekalski, PA-C 03/14/15 [id number]  Dione Boozeavid Glick, MD 03/14/15 [phone]

## 2015-03-15 LAB — CULTURE, BLOOD (ROUTINE X 2)
Culture: NO GROWTH
Culture: NO GROWTH

## 2015-04-07 ENCOUNTER — Ambulatory Visit: Payer: Self-pay

## 2015-10-31 IMAGING — CT CT ABD-PELV W/ CM
1 of 2 series · 15 of 32 positions shown, 19 images · IV contrast (OMNIPAQUE 300)
Comparison: 03/07/2015

CLINICAL DATA: Lower abdominal pain.

EXAM:
CT ABDOMEN AND PELVIS WITH CONTRAST
TECHNIQUE: Multidetector CT imaging of the abdomen and pelvis was performed
using the standard protocol following bolus administration of
intravenous contrast.
CONTRAST:  50mL OMNIPAQUE IOHEXOL 300 MG/ML SOLN, 100mL OMNIPAQUE
IOHEXOL 300 MG/ML SOLN

[Series 2: abd/pel with · axial · 0.70mm/px · z∈[-455,-35]mm · 15 of 93 slices shown, 19 images]
[im 5/93  soft-tissue]
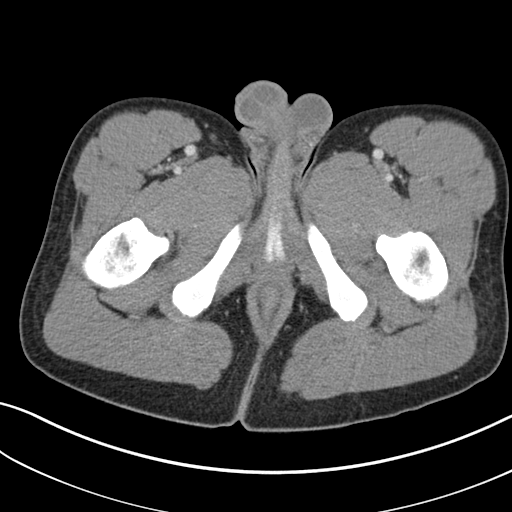
[im 5/93  bone]
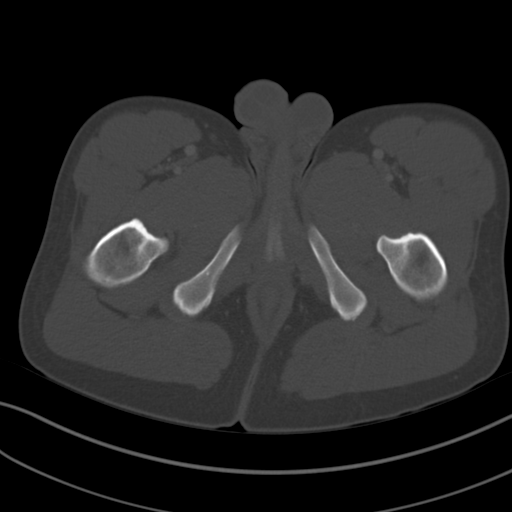
[im 13/93  soft-tissue]
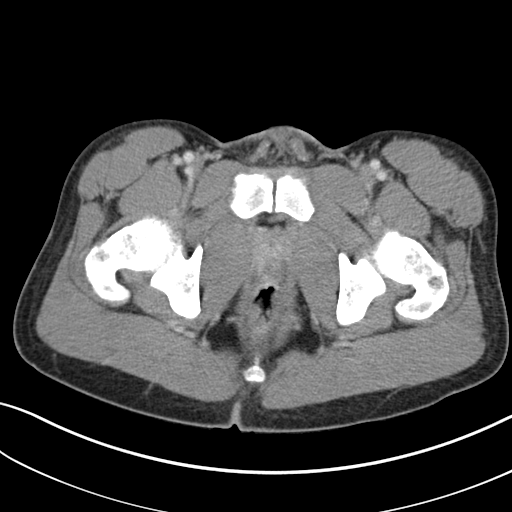
[im 21/93  soft-tissue]
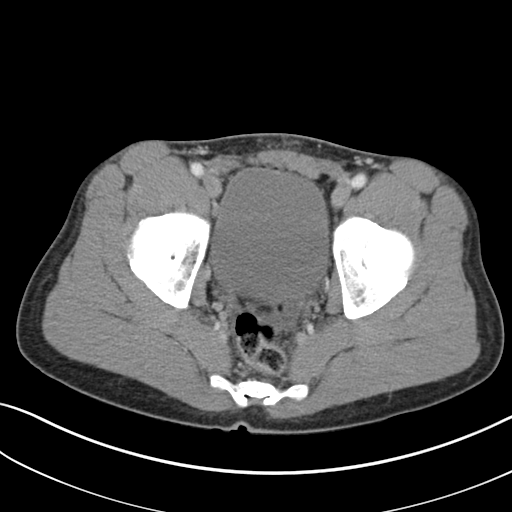
[im 25/93  soft-tissue]
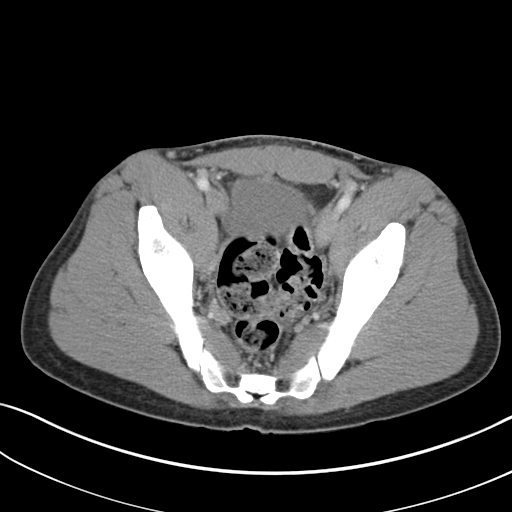
[im 33/93  soft-tissue]
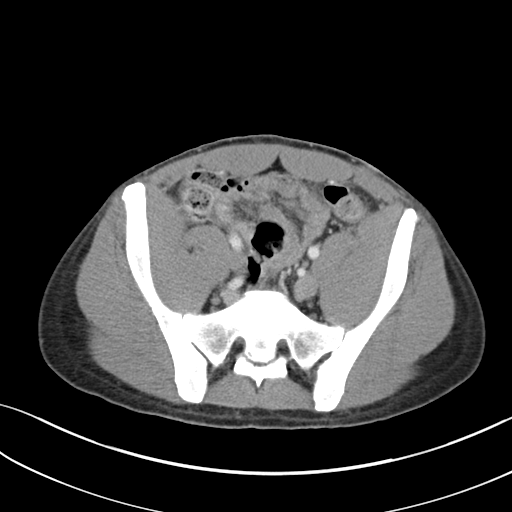
[im 41/93  soft-tissue]
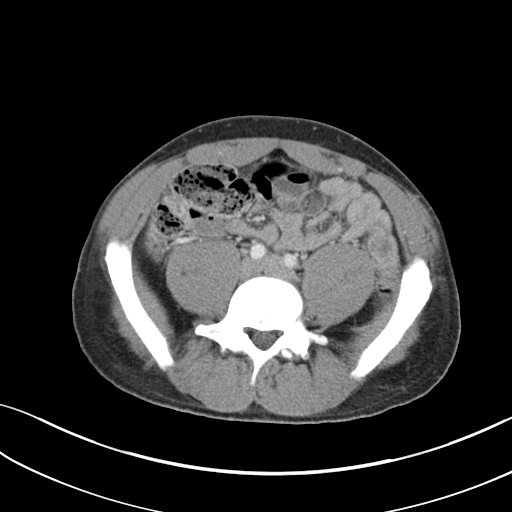
[im 49/93  soft-tissue]
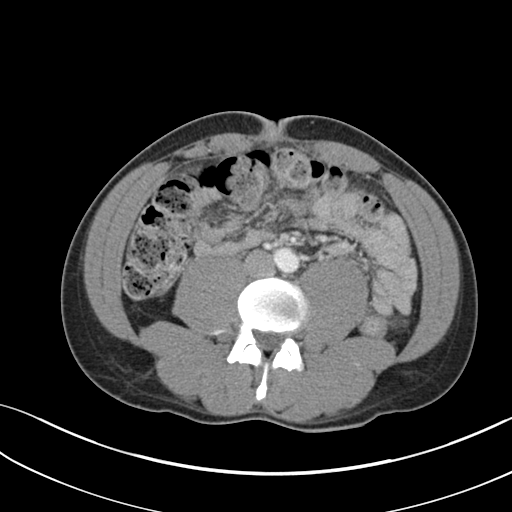
[im 53/93  soft-tissue]
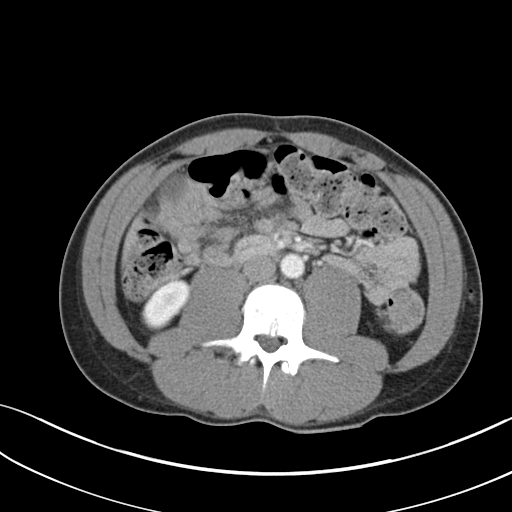
[im 61/93  soft-tissue]
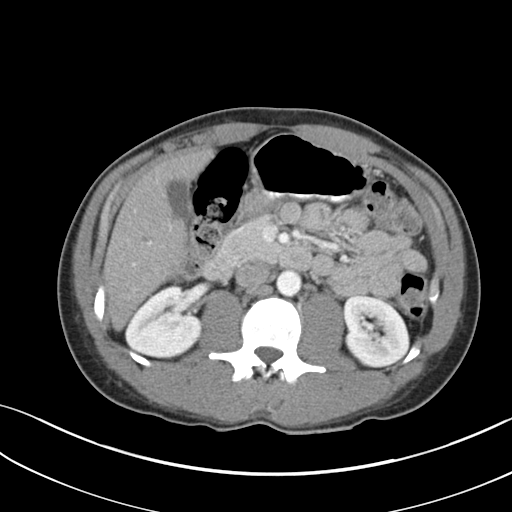
[im 61/93  bone]
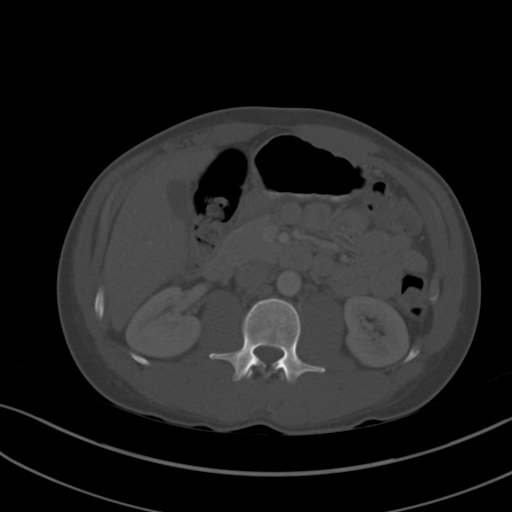
[im 69/93  soft-tissue]
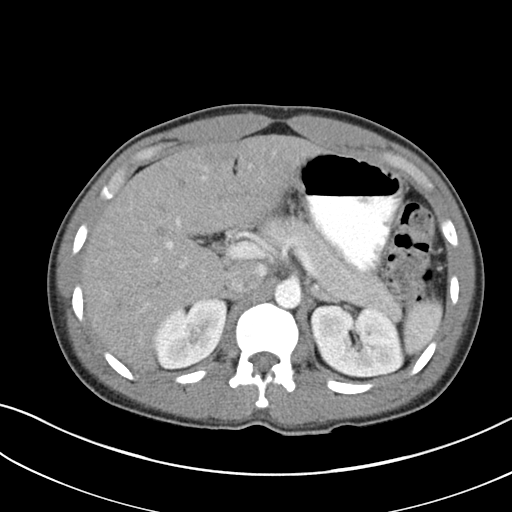
[im 73/93  soft-tissue]
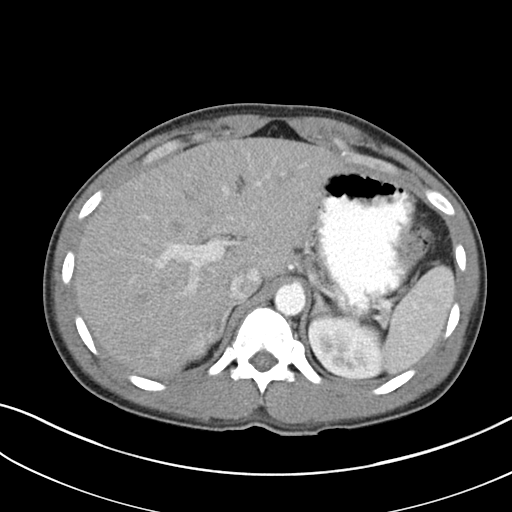
[im 77/93  lung]
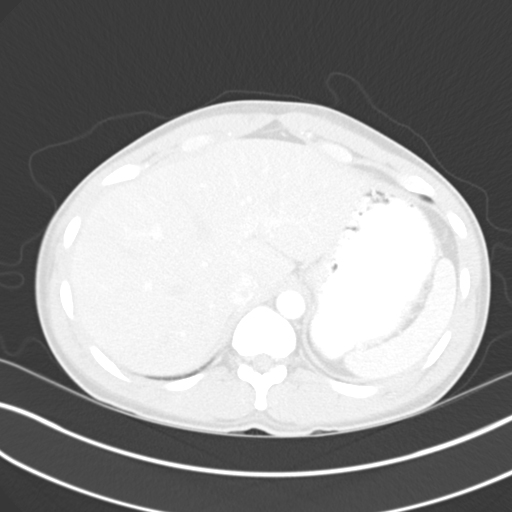
[im 81/93  soft-tissue]
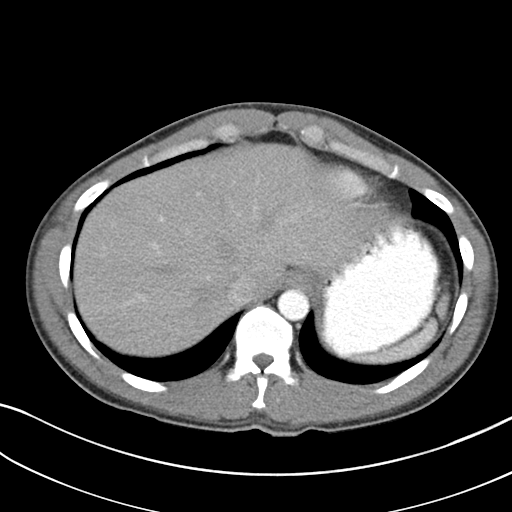
[im 81/93  lung]
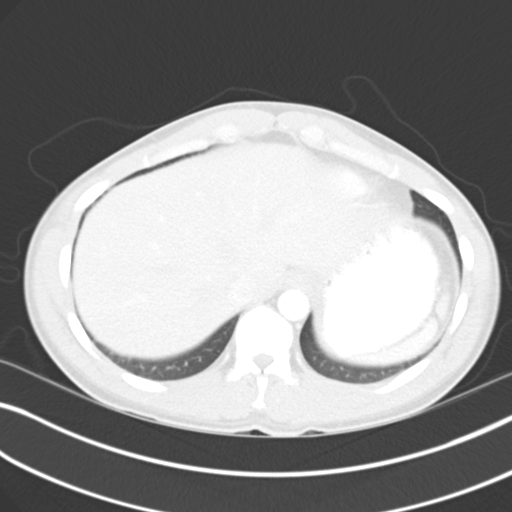
[im 85/93  lung]
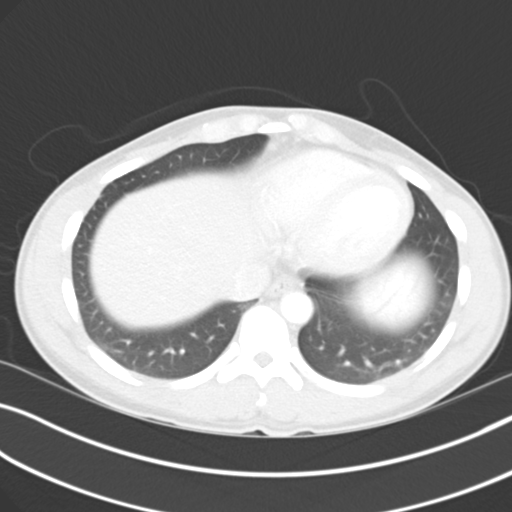
[im 89/93  soft-tissue]
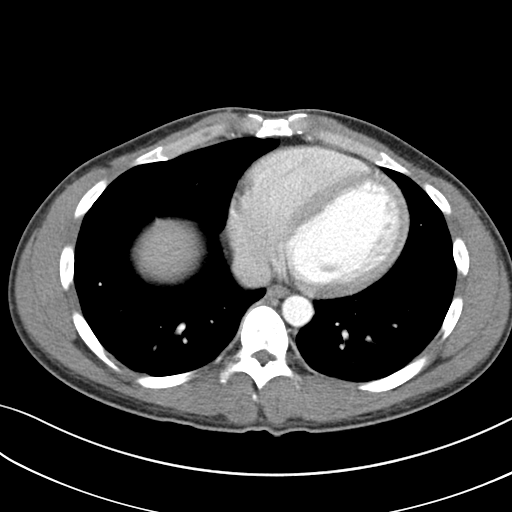
[im 89/93  lung]
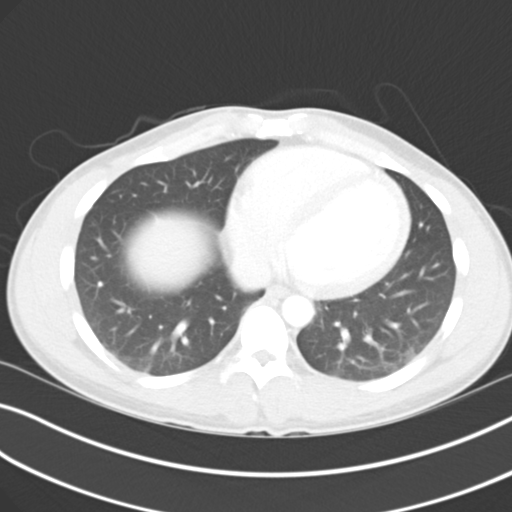

[15 of 32 positions shown; findings below may reference images not displayed]

FINDINGS: There are normal appearances of the liver, spleen, pancreas,
adrenals and kidneys. There is no bowel obstruction or perforation.
No focal inflammatory changes are evident. There is recent
appendectomy. There is a tiny puddle of fluid in the right pericolic
gutter and a trace fluid in the dependent pelvic peritoneal
recesses, probably postoperative in nature. There is no evidence of
a contained fluid collection or abscess. There are no significant
abnormalities in the lower chest. No significant musculoskeletal
abnormality.
IMPRESSION: Tiny fluid collections in the right pericolic gutter and dependent
pelvic peritoneum, likely postoperative in nature. No evidence of
abscess, extraluminal air, or focal inflammatory change.

## 2022-08-03 ENCOUNTER — Inpatient Hospital Stay
Admit: 2022-08-03 | Discharge: 2022-08-04 | Disposition: A | Discharge status: Home / Self Care | Attending: Student in an Organized Health Care Education/Training Program

## 2022-08-03 DIAGNOSIS — T50901A Poisoning by unspecified drugs, medicaments and biological substances, accidental (unintentional), initial encounter: Secondary | ICD-10-CM

## 2022-08-03 DIAGNOSIS — T50991A Poisoning by other drugs, medicaments and biological substances, accidental (unintentional), initial encounter: Secondary | ICD-10-CM

## 2022-08-03 MED ORDER — ibuprofen tablet 800 mg
600 | Freq: Once | ORAL | Status: AC
Start: 2022-08-03 — End: 2022-08-03
  Administered 2022-08-04: 04:00:00 800 mg via ORAL

## 2022-08-03 MED ORDER — acetaminophen (Tylenol) tablet 975 mg
325 | Freq: Once | ORAL | Status: AC
Start: 2022-08-03 — End: 2022-08-03
  Administered 2022-08-04: 04:00:00 975 mg via ORAL

## 2022-08-03 NOTE — Other (Signed)
Patient Education   Table of Contents     Opioid Overdose     To view videos and all your education online visit,   [email or web address]   or scan this QR code with your smartphone.   Access to this content will expire in one year.                    Opioid Overdose     Opioids are drugs that are often used to treat pain. Opioids include illegal drugs, such as heroin, as well as prescription pain medicines, such as codeine, morphine, hydrocodone, and fentanyl.   An opioid overdose happens when you take too much of an opioid. An overdose may be intentional or accidental and can happen with any type of opioid.   The effects of an overdose can be mild, dangerous, or even deadly. Opioid overdose is a medical emergency.   What are the causes?    This condition may be caused by:     Taking too much of an opioid on purpose.     Taking too much of an opioid by accident.     Using two or more substances that contain opioids at the same time.     Taking an opioid with a substance that affects your heart, breathing, or blood pressure. These include alcohol, tranquilizers, sleeping pills, illegal drugs, and some over-the-counter medicines.      This condition may also happen due to an error made by:     A health care provider who prescribes a medicine.     The pharmacist who fills the prescription.     What increases the risk?    This condition is more likely in:     Children. They may be attracted to colorful pills. Because of a child's small size, even a small amount of a medicine can be dangerous.     Older people. They may be taking many different medicines. Older people may have difficulty reading labels or remembering when they last took their medicines. They may also be more sensitive to the effects of opioids.     People with chronic medical conditions, especially heart, liver, kidney, or neurological diseases.     People who take an opioid for a long period of time.     People who take opioids and use  illegal drugs, such as heroin, or other substances, such as alcohol.     People who:     Have a history of drug or alcohol abuse.     Have certain mental health conditions.     Have a history of previous drug overdoses.     People who take opioids that are not prescribed for them.     What are the signs or symptoms?    Symptoms of this condition depend on the type of opioid and the amount that was taken. Common symptoms include:     Sleepiness or difficulty waking from sleep.     Confusion.     Slurred speech.     Slowed breathing and a slow pulse (bradycardia).     Nausea and vomiting.     Abnormally small pupils.      Signs and symptoms that require emergency treatment include:     Cold, clammy, and pale skin.     Blue lips and fingernails.     Vomiting.     Gurgling sounds in the throat.     A pulse that is very slow or difficult  to detect.     Breathing that is very irregular, slow, noisy, or difficult to detect.     Inability to respond to speech or be awakened from sleep (stupor).     Seizures.     How is this diagnosed?    This condition is diagnosed based on your symptoms and medical history. It is important to tell your health care provider:     About all of the opioids that you took.     When you took the opioids.     Whether you were drinking alcohol or using marijuana, cocaine, or other drugs.      Your health care provider will do a physical exam. This exam may include:     Checking and monitoring your heart rate and rhythm, breathing rate, temperature, and blood pressure.     Measuring oxygen levels in your blood.     Checking for abnormally small pupils.     You may also have blood tests or urine tests. You may have X-rays if you are having severe breathing problems.   How is this treated?   This condition requires immediate medical treatment and hospitalization. Reversing the effects of the opioid is the first step in treatment. If you have a Narcan kit or naloxone, use it right away. Follow your  health care provider's instructions. A friend or family member can also help you with this.    The rest of your treatment will be given in the hospital intensive care (ICU). Treatment in the hospital may include:     Giving salts and minerals (electrolytes) along with fluids through an IV.     Inserting a breathing tube (endotracheal tube) in your airway to help you breathe if you cannot breathe on your own or you are in danger of not being able to breathe on your own.     Giving oxygen through a small tube under your nose.     Passing a tube through your nose and into your stomach (nasogastric tube, or NG tube) to empty your stomach.     Giving medicines that:     Increase your blood pressure.     Relieve nausea and vomiting.     Relieve abdominal pain and cramping.     Reverse the effects of the opioid (naloxone).     Monitoring your heart and oxygen levels.     Ongoing counseling and mental health support if you intentionally overdosed or used an illegal drug.     Follow these instructions at home:      Medicines     Take over-the-counter and prescription medicines only as told by your health care provider.     Always ask your health care provider about possible side effects and interactions of any new medicine that you start taking.     Keep a list of all the medicines that you take, including over-the-counter medicines. Bring this list with you to all your medical visits.     General instructions     Drink enough fluid to keep your urine pale yellow.     Keep all follow-up visits. This is important.       How is this prevented?       Read the drug inserts that come with your opioid pain medicines.     Take medicines only as told by your health care provider. Do not  take more medicine than you are told. Do not  take medicines more frequently than you are  told.    Do not  drink alcohol or take sedatives when taking opioids.    Do not  use illegal or recreational drugs, including cocaine, ecstasy, and marijuana.     Do not  take opioid medicines that are not prescribed for you.     Store all medicines in safety containers that are out of the reach of children.     Get help if you are struggling with:     Alcohol or drug use.     Depression or another mental health problem.     Thoughts of hurting yourself or another person.     Keep the phone number of your local poison control center near your phone or in your mobile phone. In the [place] the hotline of the [facility] is (707)353-1716(800) 979-178-0783.     If you were prescribed naloxone, make sure you understand how to take it.     Contact a health care provider if:       You need help understanding how to take your pain medicines.     You feel your medicines are too strong.     You are concerned that your pain medicines are not working well for your pain.     You develop new symptoms or side effects when you are taking medicines.     Get help right away if:       You or someone else is having symptoms of an opioid overdose. Get help even if you are not sure.     You have thoughts about hurting yourself or others.     You have:     Chest pain.     Difficulty breathing.     A loss of consciousness.     These symptoms may represent a serious problem that is an emergency. Do not wait to see if the symptoms will go away. Get medical help right away. Call your local emergency services (911 in the [place] Do not drive yourself to the hospital.   If you ever feel like you may hurt yourself or others, or have thoughts about taking your own life, get help right away. You can go to your nearest emergency department or:    Call your local emergency services (911 in the [place]    Call a suicide crisis helpline, such as the National Suicide Prevention Lifeline at 470-520-73061-7858815782 or 988 in the [place] This is open 24 hours a day in the [place]    Text the Crisis Text Line at 810-368-4584741741 (in the [place]     Summary       Opioids are drugs that are often used to treat pain. Opioids include illegal  drugs, such as heroin, as well as prescription pain medicines.     An opioid overdose happens when you take too much of an opioid.     Overdoses can be intentional or accidental.     Opioid overdose is very dangerous. It is a life-threatening emergency.     If you or someone you know is experiencing an opioid overdose, get help right away.     This information is not intended to replace advice given to you by your health care provider. Make sure you discuss any questions you have with your health care provider.     Document Released: 01/18/2005 Document Revised: 07/06/2021 Document Reviewed: 03/23/2021     Elsevier Patient Education ? 2023 [employer]

## 2022-08-03 NOTE — ED Provider Notes (Signed)
HPI   Chief Complaint   Patient presents with   . Drug Overdose       46 year old male presenting with report of substance overdose.  Per triage patient was found down and admitted to using cocaine, heroin and "some other stuff".  Patient denies injection and reports that he mostly smoked substances.  Patient arousable and able to answer questions.  Patient denies hitting his head.  Patient did receive Narcan per triage note but does not really remember this.  Patient has no medical complaints at this time and reports that he is hungry.  Patient denies alcohol use at this time.  Patient denies any medical history.  Patient denies history of PE/DVT/ACS/CVA.  Allergies as reported in chart.      History provided by:  Patient and medical records  History limited by:  Mental status change  Illness  Severity:  Mild  Onset quality:  Unable to specify  Timing:  Unable to specify  Progression:  Improving  Chronicity:  New                Glasgow Coma Scale Score: 15                                  Patient History   History reviewed. No pertinent past medical history.  History reviewed. No pertinent surgical history.  No family history on file.  Social History     Tobacco Use   . Smoking status: Unknown   . Smokeless tobacco: Not on file   Substance Use Topics   . Alcohol use: Not on file   . Drug use: Not on file       Review of Systems   Review of Systems   Unable to perform ROS: Mental status change   Psychiatric/Behavioral: Positive for confusion.       Physical Exam   ED Triage Vitals [08/03/22 1631]   Temp Pulse Resp BP   36.6 C (97.9 F) 91 14 123/77      SpO2 Temp Source Heart Rate Source Patient Position   97 % Oral Monitor Lying      BP Location FiO2 (%)     Right arm --       Physical Exam  Constitutional:       General: He is not in acute distress.     Comments:   Lying in bed, NAD.  Arouses to voice.  Intermittently able to answer questions and follow commands.  Normocephalic, atraumatic. EOMI. PERRL, injected  conjunctivae bilaterally. RRR, no m/r/g.  CTAB, no w/r/r. Abdomen soft, nontender to palpation.  Pulses intact.  No peripheral edema.  Sensation intact.    HENT:      Head: Normocephalic and atraumatic.      Nose: Nose normal.      Mouth/Throat:      Mouth: Mucous membranes are dry.   Eyes:      Pupils: Pupils are equal, round, and reactive to light.   Cardiovascular:      Rate and Rhythm: Normal rate and regular rhythm.      Pulses: Normal pulses.      Heart sounds: Normal heart sounds. No murmur heard.     No friction rub. No gallop.   Pulmonary:      Effort: Pulmonary effort is normal. No respiratory distress.      Breath sounds: Normal breath sounds. No stridor. No wheezing or rales.  Abdominal:      General: Bowel sounds are normal. There is no distension.      Palpations: Abdomen is soft.      Tenderness: There is no abdominal tenderness. There is no guarding or rebound.   Musculoskeletal:         General: No deformity.   Skin:     Coloration: Skin is not jaundiced.   Neurological:      Mental Status: He is alert. He is disoriented.       No orders to display       Labs Reviewed - No data to display    ED Course & MDM   ED Course as of 08/04/22 0149   Thu Aug 03, 2022   1942 ECG 12 lead  Heart rate 79, PR 147, QRS 99, 18, sinus, right bundle branch block, no STEMI         Diagnoses as of 08/04/22 0149   Accidental overdose, initial encounter       Medical Decision Making  46 year old male presenting with report of substance overdose.  Per triage patient was found down and admitted to using cocaine, heroin and "some other stuff".  Patient denies injection and reports that he mostly smoked substances.  Patient arousable and able to answer questions.  Patient denies hitting his head.  Patient did receive Narcan per triage note but does not really remember this.  Patient has no medical complaints at this time and reports that he is hungry.  Patient denies alcohol use at this time.  Patient denies any medical  history.  Patient denies history of PE/DVT/ACS/CVA.  Allergies as reported in chart.    ROS positive for overdose    --------------------            08/03/22               1845     --------------------   BP:       113/78     Pulse:      71       Resp:       18       Temp:                SpO2:      97%      --------------------      Lying in bed, NAD.  Arouses to voice.  Intermittently able to answer questions and follow commands.  Normocephalic, atraumatic. EOMI. PERRL, injected conjunctivae bilaterally. RRR, no m/r/g.  CTAB, no w/r/r. Abdomen soft, nontender to palpation.  Pulses intact.  No peripheral edema.  Sensation intact.       #Overdose:  46 year old male presenting with report of substance overdose.   Per report, patient w/ concern for polysubstance overdose s/p narcan by EMS.  Patient protecting airway.  No respiratory distress.  Afebrile, non-tachycardic, normotensive, no evidence of trauma.  Patient able to answer questions and follow commands.  No evidence of acute withdrawal at this time.  Will continue to monitor.  No imaging at this time.     -EKG/Glucose  -Monitor/Symptomatic Management    #Dispo: Pending reeval  UPDATE - Patient w/ reassuring vitals - no hypoxia.  Patient normotensive, afebrile, non-tachycardic.  Patient slowly more alert - requesting food and drink - tolerating PO.  Reports that he is feeling better than earlier.  [facility]/ [facility] for detox options.  Provide strict return precautions.      Accidental overdose, initial  encounter: acute illness or injury  Amount and/or Complexity of Data Reviewed  External Data Reviewed: notes.  Labs: ordered. Decision-making details documented in ED Course.  ECG/medicine tests: ordered and independent interpretation performed. Decision-making details documented in ED Course.      Risk  OTC drugs.                Raeanne Barry, MD  08/04/22 [phone]

## 2022-08-03 NOTE — Discharge Instructions (Addendum)
You were seen in the ED for evaluation after a substance overdose.  You received narcan prior to arrival.  [facility] provides resources to help with detox and overdose.  You may follow up with the [facility] tomorrow for further options.  Seek medical attention for chest pain, shortness of breath, fever, syncope.      [facility]  [address], [place], [place] [address]

## 2022-08-03 NOTE — ED Triage Notes (Signed)
Pt brought in by ems after being found unresponsive by a bystander. Given 8 mg narcan by bystander. Patient is arousable to verbal stimuli at this time. Admits to cocaine, heroin, "some other stuff".

## 2022-08-04 MED FILL — IBUPROFEN 200 MG TABLET: 200 200 mg | ORAL | Qty: 1

## 2022-08-04 MED FILL — ACETAMINOPHEN 325 MG TABLET: 325 325 mg | ORAL | Qty: 3
